# Patient Record
Sex: Female | Born: 1995 | Hispanic: Yes | Marital: Married | Smoking: Current every day smoker
Health system: Southern US, Community
[De-identification: ages and names within clinical notes are randomized; demographics above are authoritative.]

## PROBLEM LIST (undated history)

## (undated) DIAGNOSIS — N809 Endometriosis, unspecified: Secondary | ICD-10-CM

## (undated) DIAGNOSIS — F329 Major depressive disorder, single episode, unspecified: Secondary | ICD-10-CM

## (undated) DIAGNOSIS — F419 Anxiety disorder, unspecified: Secondary | ICD-10-CM

## (undated) DIAGNOSIS — F17200 Nicotine dependence, unspecified, uncomplicated: Secondary | ICD-10-CM

## (undated) DIAGNOSIS — F32A Depression, unspecified: Secondary | ICD-10-CM

## (undated) DIAGNOSIS — N83209 Unspecified ovarian cyst, unspecified side: Secondary | ICD-10-CM

## (undated) HISTORY — PX: OVARIAN CYST REMOVAL: SHX89

## (undated) HISTORY — DX: Unspecified ovarian cyst, unspecified side: N83.209

## (undated) HISTORY — PX: CHOLECYSTECTOMY: SHX55

## (undated) HISTORY — DX: Depression, unspecified: F32.A

## (undated) HISTORY — DX: Endometriosis, unspecified: N80.9

## (undated) HISTORY — DX: Nicotine dependence, unspecified, uncomplicated: F17.200

## (undated) HISTORY — DX: Anxiety disorder, unspecified: F41.9

---
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
## 2019-09-14 ENCOUNTER — Ambulatory Visit: Payer: Medicaid Other | Admitting: Family Medicine

## 2019-09-14 ENCOUNTER — Other Ambulatory Visit: Payer: Self-pay

## 2019-09-14 ENCOUNTER — Encounter: Payer: Self-pay | Admitting: Family Medicine

## 2019-09-14 ENCOUNTER — Other Ambulatory Visit (HOSPITAL_COMMUNITY)
Admission: RE | Admit: 2019-09-14 | Discharge: 2019-09-14 | Disposition: A | Discharge status: Home / Self Care | Payer: Medicaid Other | Source: Ambulatory Visit | Attending: Family Medicine | Admitting: Family Medicine

## 2019-09-14 VITALS — BP 122/82 | HR 78 | Temp 98.3°F | Resp 14 | Ht 67.0 in | Wt 164.6 lb

## 2019-09-14 DIAGNOSIS — Z23 Encounter for immunization: Secondary | ICD-10-CM

## 2019-09-14 DIAGNOSIS — F419 Anxiety disorder, unspecified: Secondary | ICD-10-CM

## 2019-09-14 DIAGNOSIS — F332 Major depressive disorder, recurrent severe without psychotic features: Secondary | ICD-10-CM | POA: Diagnosis not present

## 2019-09-14 DIAGNOSIS — Z62810 Personal history of physical and sexual abuse in childhood: Secondary | ICD-10-CM

## 2019-09-14 DIAGNOSIS — F53 Postpartum depression: Secondary | ICD-10-CM

## 2019-09-14 DIAGNOSIS — Z818 Family history of other mental and behavioral disorders: Secondary | ICD-10-CM

## 2019-09-14 DIAGNOSIS — N898 Other specified noninflammatory disorders of vagina: Secondary | ICD-10-CM

## 2019-09-14 DIAGNOSIS — Z7689 Persons encountering health services in other specified circumstances: Secondary | ICD-10-CM

## 2019-09-14 DIAGNOSIS — O99345 Other mental disorders complicating the puerperium: Secondary | ICD-10-CM

## 2019-09-14 DIAGNOSIS — Z113 Encounter for screening for infections with a predominantly sexual mode of transmission: Secondary | ICD-10-CM | POA: Insufficient documentation

## 2019-09-14 DIAGNOSIS — F1111 Opioid abuse, in remission: Secondary | ICD-10-CM

## 2019-09-14 MED ORDER — METRONIDAZOLE 500 MG PO TABS: 500.0000 mg | ORAL_TABLET | Freq: Two times a day (BID) | ORAL | 0 refills | Status: DC

## 2019-09-14 MED ORDER — SERTRALINE HCL 25 MG PO TABS: ORAL_TABLET | ORAL | 0 refills | Status: DC

## 2019-09-14 MED ORDER — HYDROXYZINE PAMOATE 25 MG PO CAPS: 25.0000 mg | ORAL_CAPSULE | Freq: Every day | ORAL | 0 refills | Status: DC
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
## 2019-09-17 ENCOUNTER — Encounter: Payer: Self-pay | Admitting: Family Medicine

## 2019-09-18 ENCOUNTER — Encounter: Payer: Self-pay | Admitting: *Deleted

## 2019-09-18 ENCOUNTER — Ambulatory Visit: Payer: Self-pay | Admitting: *Deleted

## 2019-09-18 DIAGNOSIS — F419 Anxiety disorder, unspecified: Secondary | ICD-10-CM

## 2019-09-18 DIAGNOSIS — F332 Major depressive disorder, recurrent severe without psychotic features: Secondary | ICD-10-CM

## 2019-09-18 LAB — CERVICOVAGINAL ANCILLARY ONLY
Bacterial Vaginitis (gardnerella): POSITIVE — AB
Candida Glabrata: NEGATIVE
Candida Vaginitis: NEGATIVE
Chlamydia: NEGATIVE
Comment: NEGATIVE
Comment: NEGATIVE
Comment: NEGATIVE
Comment: NEGATIVE
Comment: NEGATIVE
Comment: NORMAL
Neisseria Gonorrhea: NEGATIVE
Trichomonas: NEGATIVE

## 2019-09-18 NOTE — Progress Notes (Signed)
This encounter was created in error - please disregard.

## 2019-09-19 NOTE — Chronic Care Management (AMB) (Signed)
Care Management    Clinical Social Work General Note  09/19/2019 Name: Whitney Wong MRN: [id number] DOB: 1996/01/13  Whitney Wong is a 23 y.o. year old female who is a primary care patient of Danelle Berry, [place]. The CCM was consulted to assist the patient with Mental Health Counseling and Resources.   Ms. Staples was given information about Care Management services today including:  1. CM service includes personalized support from designated clinical staff supervised by her physician, including individualized plan of care and coordination with other care providers 2. 24/7 contact phone numbers for assistance for urgent and routine care needs. Patient agreed to services and verbal consent obtained.   Review of patient status, including review of consultants reports, relevant laboratory and other test results, and collaboration with appropriate care team members and the patient's provider was performed as part of comprehensive patient evaluation and provision of chronic care management services.    SDOH (Social Determinants of Health) screening performed today. See Care Plan Entry related to challenges with: Tobacco Use Stress Physical Activity  Advanced Directives Status: <no information> See Care Plan for related entries.   Outpatient Encounter Medications as of 09/18/2019  Medication Sig  . hydrOXYzine (VISTARIL) 25 MG capsule Take 1-4 capsules (25-100 mg total) by mouth at bedtime.  . methadone (DOLOPHINE) 10 MG tablet Take 10 mg by mouth every 8 (eight) hours.  . metroNIDAZOLE (FLAGYL) 500 MG tablet Take 1 tablet (500 mg total) by mouth 2 (two) times daily.  . sertraline (ZOLOFT) 25 MG tablet Take one tap PO at bedtime daily x 2 weeks, then increase to 2 tabs (50 mg) PO q bedtime daily   No facility-administered encounter medications on file as of 09/18/2019.     Goals Addressed            This Visit's Progress   . "I want to get to the bottom of why I have so much anxiety"  (pt-stated)       Current Barriers:  . Chronic Mental Health needs related to anxiety . Lacks knowledge of community resource: local mental health agencies that treat chronic anxiety . Suicidal Ideation/Homicidal Ideation: No  Clinical Social Work Goal(s):  Marland Kitchen Over the next 90 days, patient will work with SW bi-weekly by telephone or in person to identify a local therapist that specializes in the treatment of chronic anxiety .   Interventions: . Patient interviewed and appropriate assessments performed: PHQ 2 and PHQ 9 . Patient interviewed and appropriate assessments performed . Provided patient with information about local mental health agencies that treat anxiety: [facility] [phone] and [phone] contact information provided . Discussed plans with patient for ongoing care management follow up and provided patient with direct contact information for care management team . Encouraged patient to contact agency of choice to schedule the initial intake appointment . Emotional support and positive reinforcement provided related to patient's open discussion of post partum depression, her past addiction, current involvement with the [facility] Social [facility] and the methadone clinic . Positive reinforcement provided in regards to patient's virtual NA meeting attendance 1-2 times per week  . Confirmed that patient has no sponsor however has a supportive husband who transports her to the methadone clinic daily . Confirmed that patient sees a substance abuse counselor at the [facility] 1 day per week  Patient Self Care Activities:  . Self administers medications as prescribed . Attends all scheduled provider appointments . Performs ADL's independently . Performs IADL's independently .  Calls provider office for new concerns or questions . Motivation for treatment . Strong family or social support  Patient Coping Strengths:  . Family . Self  Advocate . Able to Communicate Effectively  Patient Self Care Deficits:  . Lacks knowledge of in network mental health providers  Initial goal documentation         Follow Up Plan: SW will follow up with patient by phone over the next 2 weeks       [place], [place] Worker  [place] Care Management [phone]

## 2019-09-19 NOTE — Patient Instructions (Addendum)
Thank you allowing the Chronic Care Management Team to be a part of your care! It was a pleasure speaking with you today!  1. Please call the mental health agency of choice to schedule your initial appointment 2. Please call this social worker with any questions or concerns regaring your mental health needs  CCM (Chronic Care Management) Team   Neldon Labella RN, BSN Nurse Care Coordinator  [phone]  Ruben Reason PharmD  Clinical Pharmacist  [phone]   [place], LCSW Clinical Social Worker [phone]  Goals Addressed            This Visit's Progress   . "I want to get to the bottom of why I have so much anxiety" (pt-stated)       Current Barriers:  . Chronic Mental Health needs related to anxiety . Lacks knowledge of community resource: local mental health agencies that treat chronic anxiety . Suicidal Ideation/Homicidal Ideation: No  Clinical Social Work Goal(s):  Marland Kitchen Over the next 90 days, patient will work with SW bi-weekly by telephone or in person to identify a local therapist that specializes in the treatment of chronic anxiety .   Interventions: . Patient interviewed and appropriate assessments performed: PHQ 2 and PHQ 9 . Patient interviewed and appropriate assessments performed . Provided patient with information about local mental health agencies that treat anxiety: [facility] [phone] and [facility]-[phone] contact information provided . Discussed plans with patient for ongoing care management follow up and provided patient with direct contact information for care management team . Encouraged patient to contact agency of choice to schedule the initial intake appointment . Emotional support and positive reinforcement provided related to patient's open discussion of post partum depression, her past addiction, current involvement with the Department of Social Services and the methadone clinic . Positive  reinforcement provided in regards to patient's virtual NA meeting attendance 1-2 times per week  . Confirmed that patient has no sponsor however has a supportive husband who transports her to the methadone clinic daily . Confirmed that patient sees a substance abuse counselor at the [facility] 1 day per week  Patient Self Care Activities:  . Self administers medications as prescribed . Attends all scheduled provider appointments . Performs ADL's independently . Performs IADL's independently . Calls provider office for new concerns or questions . Motivation for treatment . Strong family or social support  Patient Coping Strengths:  . Family . Self Advocate . Able to Communicate Effectively  Patient Self Care Deficits:  . Lacks knowledge of in network mental health providers  Initial goal documentation         The patient verbalized understanding of instructions provided today and declined a print copy of patient instruction materials.   Telephone follow up appointment with care management team member scheduled for:10/02/19

## 2019-10-02 ENCOUNTER — Telehealth: Payer: Medicaid Other | Admitting: *Deleted

## 2019-10-02 ENCOUNTER — Ambulatory Visit: Payer: Self-pay | Admitting: *Deleted

## 2019-10-02 NOTE — Chronic Care Management (AMB) (Signed)
    Care Management   Unsuccessful Call Note 10/02/2019 Name: Whitney Wong MRN: [id number] DOB: 05/05/1996  Patient  is a 23 year old female who sees Delsa Grana, [place] for primary care. Delsa Grana PA-C asked the CCM team to consult the patient for Mental Health Counseling and Resources.    This social worker was unable to reach patient via telephone today to follow up on resources provided. I have left HIPAA compliant voicemail asking patient to return my call. (unsuccessful outreach #1).   Plan: Will follow-up within 7 business days via telephone.     Elliot Gurney, [place] Administrator, [facility] [phone]

## 2019-10-08 ENCOUNTER — Ambulatory Visit (INDEPENDENT_AMBULATORY_CARE_PROVIDER_SITE_OTHER): Payer: Self-pay | Admitting: Psychiatry

## 2019-10-08 ENCOUNTER — Other Ambulatory Visit: Payer: Self-pay

## 2019-10-08 DIAGNOSIS — Z91199 Patient's noncompliance with other medical treatment and regimen due to unspecified reason: Secondary | ICD-10-CM | POA: Insufficient documentation

## 2019-10-08 DIAGNOSIS — Z5329 Procedure and treatment not carried out because of patient's decision for other reasons: Secondary | ICD-10-CM

## 2019-10-08 NOTE — Progress Notes (Signed)
No response to call or text. 

## 2019-10-09 ENCOUNTER — Telehealth: Payer: Self-pay | Admitting: *Deleted

## 2019-10-09 ENCOUNTER — Ambulatory Visit: Payer: Self-pay | Admitting: *Deleted

## 2019-10-09 NOTE — Chronic Care Management (AMB) (Signed)
    Care Management   Unsuccessful Call Note 10/09/2019 Name: Whitney Wong MRN: [id number] DOB: 08-19-1996  Patient  is a 23 year old female  who sees Delsa Grana, [place] for primary care. Delsa Grana, PA-C asked the CCM team to consult the patient for Mental Health Counseling and Resources.     This social worker was unable to reach patient via telephone today to follow up on mental health resources previously provided. I have left HIPAA compliant voicemail asking patient to return my call. (unsuccessful outreach #2).   Plan: Will follow-up within 7 business days via telephone.     Elliot Gurney, [place] Administrator, [facility] [phone]

## 2019-10-16 ENCOUNTER — Ambulatory Visit (INDEPENDENT_AMBULATORY_CARE_PROVIDER_SITE_OTHER): Payer: Medicaid Other | Admitting: Family Medicine

## 2019-10-16 ENCOUNTER — Encounter: Payer: Self-pay | Admitting: Family Medicine

## 2019-10-16 ENCOUNTER — Ambulatory Visit: Payer: Self-pay | Admitting: *Deleted

## 2019-10-16 ENCOUNTER — Other Ambulatory Visit: Payer: Self-pay

## 2019-10-16 VITALS — HR 80 | Ht 66.0 in | Wt 157.0 lb

## 2019-10-16 DIAGNOSIS — F332 Major depressive disorder, recurrent severe without psychotic features: Secondary | ICD-10-CM

## 2019-10-16 DIAGNOSIS — F1120 Opioid dependence, uncomplicated: Secondary | ICD-10-CM

## 2019-10-16 DIAGNOSIS — F1190 Opioid use, unspecified, uncomplicated: Secondary | ICD-10-CM

## 2019-10-16 DIAGNOSIS — K5900 Constipation, unspecified: Secondary | ICD-10-CM | POA: Diagnosis not present

## 2019-10-16 DIAGNOSIS — F419 Anxiety disorder, unspecified: Secondary | ICD-10-CM | POA: Insufficient documentation

## 2019-10-16 NOTE — Progress Notes (Signed)
Name: Whitney Wong   MRN: [id number]    DOB: Nov 17, 1995   Date:10/16/2019       Progress Note  Subjective:    Chief Complaint  Chief Complaint  Patient presents with  . Follow-up  . Depression    pt never started meds, states she wants to see pysch 1st  . Constipation    1-2 bowel ,ovements a week, has tried meralax, stool softners    I connected with  Erskine Speed  on 10/16/19 at  1:00 PM EST by a video enabled telemedicine application and verified that I am speaking with the correct person using two identifiers.  I discussed the limitations of evaluation and management by telemedicine and the availability of in person appointments. The patient expressed understanding and agreed to proceed. Staff also discussed with the patient that there may be a patient responsible charge related to this service. Patient Location: home Provider Location: [facility] clinic Additional Individuals present: none  HPI   Anxiety/Depression: Patient states that her anxiety and her depression have been gradually getting better, she was hesitant to start the Zoloft medication.  She stated "no offense" she did want to see a professional for a more in-depth assessment, she also has had a past problem with medication for anxiety and depression prescribed by PCP she stated she had a horrible reaction to it and was very concerned about starting Zoloft.  She was referred to psychiatry and even had an appointment with in a few weeks of her last appointment but she was a no-show.  She states that she was having trouble with her phone and she could not get it to connect to the virtual encounter.  She has not rescheduled or attempted to call them.  She was encouraged to do so so that they will be willing to see her and they will know it was a technical difficulty and not her indifference or a true no-show.  She denies any suicidal ideation, she is sleeping better, her mood is slightly better and things are okay right now.  Depression screen [facility] 2/9 10/16/2019 09/18/2019 09/14/2019  Decreased Interest Down, Depressed, Hopeless PHQ - 2 Score Altered sleeping Tired, decreased energy Change in appetite Feeling bad or failure about yourself  Trouble concentrating Moving slowly or fidgety/restless 0 0 3  Suicidal thoughts 0 1 1  PHQ-9 Score Difficult doing work/chores Somewhat difficult Somewhat difficult Somewhat difficult   PHQ reviewed today, overall score gradually improving but still positive.    Constipation: She does complain about severe constipation having bowel movements sometimes only once a week, she recently had to manually disimpact herself because she was so severely constipated.  She states she has tried [place] about a capful once a day for about a week and it did not help her symptoms at all.  She also tried other over-the-counter stool softeners for 3 days without any success.  She has not tried any suppositories or enemas.  She is methadone.  Not doing any fiber supplements or working on any particular diet or fluid goals.  She did have a little bit of pain with her most recent bowel movements she has some hesitancy when having bowel movements because of that pain she believes she had some blood streaks on the toilet when she wiped afterwards.  She has not had any large amounts of bright red blood per rectum no blood in stool no melena.  She has had some abdominal discomfort when bowel movements have been for more than a week spaced apart.  She has never been to GI.  No N, V, incontinence of watery stool or of urine.     Patient Active Problem List   Diagnosis Date Noted  . Methadone use (HCC) 10/17/2019  . Constipation 10/17/2019  . Anxiety disorder 10/16/2019  . Severe episode of recurrent major depressive disorder, without psychotic features (HCC) 10/16/2019  . No-show for appointment 10/08/2019    Social History   Tobacco  Use  . Smoking status: Current Every Day Smoker    Packs/day: 0.25    Types: Cigarettes  . Smokeless tobacco: Never Used  Substance Use Topics  . Alcohol use: Never    Frequency: Never     Current Outpatient Medications:  .  methadone (DOLOPHINE) 10 MG tablet, Take 10 mg by mouth every 8 (eight) hours., Disp: , Rfl:  .  hydrOXYzine (VISTARIL) 25 MG capsule, Take 1-4 capsules (25-100 mg total) by mouth at bedtime. (Patient not taking: Reported on 10/16/2019), Disp: 30 capsule, Rfl: 0 .  metroNIDAZOLE (FLAGYL) 500 MG tablet, Take 1 tablet (500 mg total) by mouth 2 (two) times daily. (Patient not taking: Reported on 10/16/2019), Disp: 14 tablet, Rfl: 0 .  sertraline (ZOLOFT) 25 MG tablet, Take one tap PO at bedtime daily x 2 weeks, then increase to 2 tabs (50 mg) PO q bedtime daily (Patient not taking: Reported on 10/16/2019), Disp: 60 tablet, Rfl: 0  No Known Allergies  I personally reviewed active problem list, medication list, allergies, family history, social history, health maintenance, notes from last encounter, lab results, imaging with the patient/caregiver today.  Review of Systems  Constitutional: Negative.   HENT: Negative.   Eyes: Negative.   Respiratory: Negative.   Cardiovascular: Negative.   Gastrointestinal: Negative.   Endocrine: Negative.   Genitourinary: Negative.   Musculoskeletal: Negative.   Skin: Negative.   Allergic/Immunologic: Negative.   Neurological: Negative.   Hematological: Negative.   Psychiatric/Behavioral: Negative.   All other systems reviewed and are negative.    Objective:   Virtual encounter, vitals limited, only able to obtain the following Today's Vitals   10/16/19 1057  Pulse: 80  Weight: 157 lb (71.2 kg)  Height: 5\' 6"  (1.676 m)   Body mass index is 25.34 kg/m. Nursing Note and Vital Signs reviewed.  Physical Exam Vitals signs and nursing note reviewed.  Constitutional:      General: She is not in acute distress.     Appearance: Normal appearance. She is well-developed. She is not ill-appearing, toxic-appearing or diaphoretic.  HENT:     Head: Normocephalic and atraumatic.     Nose: Nose normal.  Eyes:     General:        Right eye: No discharge.        Left eye: No discharge.     Conjunctiva/sclera: Conjunctivae normal.  Neck:     Musculoskeletal: Normal range of motion.     Trachea: No tracheal deviation.  Pulmonary:     Effort: Pulmonary effort is normal. No respiratory distress.     Breath sounds: No stridor.  Musculoskeletal: Normal range of motion.  Skin:    Coloration: Skin is not jaundiced or pale.     Findings: No erythema or rash.  Neurological:     Mental Status: She is alert.  Motor: No abnormal muscle tone.     Coordination: Coordination normal.  Psychiatric:        Mood and Affect: Mood normal.        Behavior: Behavior normal.        Thought Content: Thought content normal.        Judgment: Judgment normal.     PE limited by telephone encounter  No results found for this or any previous visit (from the past 72 hour(s)).  Assessment and Plan:     ICD-10-CM   1. Anxiety disorder, unspecified type  F41.9    sx improving, she did not want to start meds, encouraged to call psych to reschedule her appt  2. Severe episode of recurrent major depressive disorder, without psychotic features (HCC)  F33.2    moods not as severe, referred to psych but could not get her phone to connect for appt and was "no show" strongly urged her to call and reschedule  3. Constipation, unspecified constipation type  K59.00    severe requiring recent manual disimpaction, likely SE of meds, push hydration and fiber, do bowel cleanse then start daily stool softeners  4. Methadone use (HCC)  F11.20      -Red flags and when to present for emergency care or RTC including fever >101.83F, chest pain, shortness of breath, new/worsening/un-resolving symptoms, severe abdominal pain with N/V, reviewed  with patient at time of visit. Follow up and care instructions discussed and provided in AVS - instructions given through  mychart on miralax bowel wash out - and other options for chronic constipation management with methadone use.  - I discussed the assessment and treatment plan with the patient. The patient was provided an opportunity to ask questions and all were answered. The patient agreed with the plan and demonstrated an understanding of the instructions.  Instructions given to pt in AVS: After bowel cleanse - start with 17 g (one cap full) of powder dissolved in 8 oz of water once daily and titrate up or down (to a maximum of 34 g daily) to effect.   You can also try colase pills (dolcusate) and take 1-2 x a day, pills may be easier but it may be less effective than the miralax.  You can also try increasing fiber in diet with increased fluids.  When your gut is lowed by medications, fiber can sometimes cause bloating or discomfort.  You could try lower dose fiber supplements like metamucil or benefiber and see if it help you have more normal bowel movements.  Follow up if not improving in the next month - we can refer you to GI to help evaluate your bowel.  Which were compiled for her.  Instructions sent through mychart:  Dear Ms. Mikel,  Here is the info for a bowel cleanse. Drink ONLY clear liquids ALL DAY. NO SOLID FOODS. Marland Kitchen Clear liquids include strained fruit juices (no pulp): apple, white grape, broth, water, Gatorade, Popsicles, Jell-O, coffee or tea (with no creamer). . Avoid liquids that contain red, blue or purple artificial dyes. . Drink at least 8 glasses of clear liquids throughout the day. . At noon: Swallow four of the Dulcolax (bisacodyl) 5 mg tablets. . At 5:00 pm: Mix entire container of Miralax in 64 oz. of Gatorade. Shake solution until Miralax is dissolved. Start drinking 8 oz. every 15 minutes until HALF the bottle (32 oz) is consumed. Drink it quickly. DO NOT  SIP. If you become nauseated, you may drink the solution more slowly.  You may refrigerate or add ice. . Stay near a toilet. Over the next several hours, you will have diarrhea, which can be quite sudden. This is normal, as the purpose of this process is to empty your colon.  If you do not have bowel movements, then you can drink the rest of the gatorade the next morning.  . You may have skin irritation from frequent wiping with toilet paper, you can use tucks wipes, vaseline and/or preparation H ointment to help calm/sooth.  Remainder of your constipation info with will in the after visit summary which will be available when I complete the chart and close the visit - basic info about hydration, fiber, and normal daily stool softener use which you will need to do after you complete the bowel cleanse.     I provided 15 minutes of non-face-to-face time during this encounter.  Greater than 50% of this visit was spent in direct face-to-face counseling, obtaining history and physical, discussing and educating pt on treatment plan.  Total time of this visit was 30 min.  Remainder of time involved but was not limited to reviewing chart (recent and pertinent OV notes and labs), documentation in EMR, and coordinating care and treatment plan - mostly writing out extensive instructions for pt and sending to her through mychart.     Delsa Grana, PA-C 10/16/19 4:44 PM

## 2019-10-16 NOTE — Chronic Care Management (AMB) (Signed)
Care Management    Clinical Social Work Follow Up Note  10/16/2019 Name: Whitney Wong MRN: [id number] DOB: May 18, 1996  Whitney Wong is a 23 y.o. year old female who is a primary care patient of Delsa Grana, [place]. The CCM team was consulted for assistance with Mental Health Counseling and Resources.   Review of patient status, including review of consultants reports, other relevant assessments, and collaboration with appropriate care team members and the patient's provider was performed as part of comprehensive patient evaluation and provision of chronic care management services.    Advanced Directives Status: <no information> See Care Plan for related entries.   Outpatient Encounter Medications as of 10/16/2019  Medication Sig  . hydrOXYzine (VISTARIL) 25 MG capsule Take 1-4 capsules (25-100 mg total) by mouth at bedtime. (Patient not taking: Reported on 10/16/2019)  . methadone (DOLOPHINE) 10 MG tablet Take 10 mg by mouth every 8 (eight) hours.  . metroNIDAZOLE (FLAGYL) 500 MG tablet Take 1 tablet (500 mg total) by mouth 2 (two) times daily. (Patient not taking: Reported on 10/16/2019)  . sertraline (ZOLOFT) 25 MG tablet Take one tap PO at bedtime daily x 2 weeks, then increase to 2 tabs (50 mg) PO q bedtime daily (Patient not taking: Reported on 10/16/2019)   No facility-administered encounter medications on file as of 10/16/2019.      Goals Addressed            This Visit's Progress   . "I want to get to the bottom of why I have so much anxiety" (pt-stated)       Current Barriers:  . Chronic Mental Health needs related to anxiety . Lacks knowledge of community resource: local mental health agencies that treat chronic anxiety . Suicidal Ideation/Homicidal Ideation: No  Clinical Social Work Goal(s):  Marland Kitchen Over the next 90 days, patient will work with SW bi-weekly by telephone or in person to identify a local therapist that specializes in the treatment of chronic anxiety .    Interventions: . Followed up on information previously provided about local mental health agencies that treat anxiety: [facility] [phone] and [phone]  . Confirmed that patient had a scheduled appointment with [facility] but missed it due to a problem with her phone . Contact number provided to the [place], patient encouraged to call and re-schedule the initial appointment . Discussed plans with patient for ongoing care management follow up and provided patient with direct contact information for care management team   Patient Self Care Activities:  . Self administers medications as prescribed . Attends all scheduled provider appointments . Performs ADL's independently . Performs IADL's independently . Calls provider office for new concerns or questions . Motivation for treatment . Strong family or social support  Patient Coping Strengths:  . Family . Self Advocate . Able to Communicate Effectively  Patient Self Care Deficits:  . Lacks knowledge of in network mental health providers  Please see past updates related to this goal by clicking on the "Past Updates" button in the selected goal          Follow Up Plan: SW will follow up with patient by phone over the next 7-10 business days   Elliot Gurney, [place] Worker  [facility] Care Management [phone]

## 2019-10-16 NOTE — Patient Instructions (Addendum)
Thank you allowing the Chronic Care Management Team to be a part of your care! It was a pleasure speaking with you today!  1. Please be sure to contact [place] to re-schedule your initial intake appointment.  CCM (Chronic Care Management) Team   Neldon Labella RN, BSN Nurse Care Coordinator  [phone]  Ruben Reason PharmD  Clinical Pharmacist  [phone]   Elliot Gurney, LCSW Clinical Social Worker [phone]  Goals Addressed            This Visit's Progress   . "I want to get to the bottom of why I have so much anxiety" (pt-stated)       Current Barriers:  . Chronic Mental Health needs related to anxiety . Lacks knowledge of community resource: local mental health agencies that treat chronic anxiety . Suicidal Ideation/Homicidal Ideation: No  Clinical Social Work Goal(s):  Marland Kitchen Over the next 90 days, patient will work with SW bi-weekly by telephone or in person to identify a local therapist that specializes in the treatment of chronic anxiety .   Interventions: . Followed up on information previously provided about local mental health agencies that treat anxiety: [facility] [phone] and [facility]-[phone]  . Confirmed that patient had a scheduled appointment with [place] but missed it due to a problem with her phone . Contact number provided to the [place], patient encouraged to call and re-schedule the initial appointment . Discussed plans with patient for ongoing care management follow up and provided patient with direct contact information for care management team   Patient Self Care Activities:  . Self administers medications as prescribed . Attends all scheduled provider appointments . Performs ADL's independently . Performs IADL's independently . Calls provider office for new concerns or questions . Motivation for treatment . Strong family or  social support  Patient Coping Strengths:  . Family . Self Advocate . Able to Communicate Effectively  Patient Self Care Deficits:  . Lacks knowledge of in network mental health providers  Please see past updates related to this goal by clicking on the "Past Updates" button in the selected goal          The patient verbalized understanding of instructions provided today and declined a print copy of patient instruction materials.   Telephone follow up appointment with care management team member scheduled for:10/23/19

## 2019-10-16 NOTE — Patient Instructions (Addendum)
After bowel cleanse - start with 17 g (one cap full) of powder dissolved in 8 oz of water once daily and titrate up or down (to a maximum of 34 g daily) to effect.   You can also try colase pills (dolcusate) and take 1-2 x a day, pills may be easier but it may be less effective than the miralax.  You can also try increasing fiber in diet with increased fluids.  When your gut is lowed by medications, fiber can sometimes cause bloating or discomfort.  You could try lower dose fiber supplements like metamucil or benefiber and see if it help you have more normal bowel movements.  Follow up if not improving in the next month - we can refer you to GI to help evaluate your bowel.   Constipation, Adult Constipation is when a person:  Poops (has a bowel movement) fewer times in a week than normal.  Has a hard time pooping.  Has poop that is dry, hard, or bigger than normal.   Follow these instructions at home: Eating and drinking   Eat foods that have a lot of fiber, such as: ? Fresh fruits and vegetables. ? Whole grains. ? Beans.  Eat less of foods that are high in fat, low in fiber, or overly processed, such as: ? [place] fries. ? Hamburgers. ? Cookies. ? Candy. ? Soda.  Drink enough fluid to keep your pee (urine) clear or pale yellow.  General instructions  Exercise regularly or as told by your doctor.  Go to the restroom when you feel like you need to poop. Do not hold it in.  Take over-the-counter and prescription medicines only as told by your doctor. These include any fiber supplements.  Do pelvic floor retraining exercises, such as: ? Doing deep breathing while relaxing your lower belly (abdomen). ? Relaxing your pelvic floor while pooping.  Watch your condition for any changes.  Keep all follow-up visits as told by your doctor. This is important.  Contact a doctor if:  You have pain that gets worse.  You have a fever.  You have not pooped for 4 days.  You  throw up (vomit).  You are not hungry.  You lose weight.  You are bleeding from the anus.  You have thin, pencil-like poop (stool).   Get help right away if:  You have a fever, and your symptoms suddenly get worse.  You leak poop or have blood in your poop.  Your belly feels hard or bigger than normal (is bloated).  You have very bad belly pain.  You feel dizzy or you faint. This information is not intended to replace advice given to you by your health care provider. Make sure you discuss any questions you have with your health care provider. Document Released: 04/19/2008 Document Revised: 10/14/2017 Document Reviewed: 04/21/2016 Elsevier Patient Education  2020 [employer].   Hemorrhoids Hemorrhoids are swollen veins that may develop:  In the butt (rectum). These are called internal hemorrhoids.  Around the opening of the butt (anus). These are called external hemorrhoids. Hemorrhoids can cause pain, itching, or bleeding. Most of the time, they do not cause serious problems. They usually get better with diet changes, lifestyle changes, and other home treatments. What are the causes? This condition may be caused by:  Having trouble pooping (constipation).  Pushing hard (straining) to poop.  Watery poop (diarrhea).  Pregnancy.  Being very overweight (obese).  Sitting for long periods of time.  Heavy lifting or other activity  that causes you to strain.  Anal sex.  Riding a bike for a long period of time. What are the signs or symptoms? Symptoms of this condition include:  Pain.  Itching or soreness in the butt.  Bleeding from the butt.  Leaking poop.  Swelling in the area.  One or more lumps around the opening of your butt. How is this diagnosed? A doctor can often diagnose this condition by looking at the affected area. The doctor may also:  Do an exam that involves feeling the area with a gloved hand (digital rectal exam).  Examine the area  inside your butt using a small tube (anoscope).  Order blood tests. This may be done if you have lost a lot of blood.  Have you get a test that involves looking inside the colon using a flexible tube with a camera on the end (sigmoidoscopy or colonoscopy). How is this treated? This condition can usually be treated at home. Your doctor may tell you to change what you eat, make lifestyle changes, or try home treatments. If these do not help, procedures can be done to remove the hemorrhoids or make them smaller. These may involve:  Placing rubber bands at the base of the hemorrhoids to cut off their blood supply.  Injecting medicine into the hemorrhoids to shrink them.  Shining a type of light energy onto the hemorrhoids to cause them to fall off.  Doing surgery to remove the hemorrhoids or cut off their blood supply. Follow these instructions at home: Eating and drinking   Eat foods that have a lot of fiber in them. These include whole grains, beans, nuts, fruits, and vegetables.  Ask your doctor about taking products that have added fiber (fibersupplements).  Reduce the amount of fat in your diet. You can do this by: ? Eating low-fat dairy products. ? Eating less red meat. ? Avoiding processed foods.  Drink enough fluid to keep your pee (urine) pale yellow. Managing pain and swelling   Take a warm-water bath (sitz bath) for 20 minutes to ease pain. Do this 3-4 times a day. You may do this in a bathtub or using a portable sitz bath that fits over the toilet.  If told, put ice on the painful area. It may be helpful to use ice between your warm baths. ? Put ice in a plastic bag. ? Place a towel between your skin and the bag. ? Leave the ice on for 20 minutes, 2-3 times a day. General instructions  Take over-the-counter and prescription medicines only as told by your doctor. ? Medicated creams and medicines may be used as told.  Exercise often. Ask your doctor how much and what  kind of exercise is best for you.  Go to the bathroom when you have the urge to poop. Do not wait.  Avoid pushing too hard when you poop.  Keep your butt dry and clean. Use wet toilet paper or moist towelettes after pooping.  Do not sit on the toilet for a long time.  Keep all follow-up visits as told by your doctor. This is important. Contact a doctor if you:  Have pain and swelling that do not get better with treatment or medicine.  Have trouble pooping.  Cannot poop.  Have pain or swelling outside the area of the hemorrhoids. Get help right away if you have:  Bleeding that will not stop. Summary  Hemorrhoids are swollen veins in the butt or around the opening of the butt.  They  can cause pain, itching, or bleeding.  Eat foods that have a lot of fiber in them. These include whole grains, beans, nuts, fruits, and vegetables.  Take a warm-water bath (sitz bath) for 20 minutes to ease pain. Do this 3-4 times a day. This information is not intended to replace advice given to you by your health care provider. Make sure you discuss any questions you have with your health care provider. Document Released: 08/10/2008 Document Revised: 11/09/2018 Document Reviewed: 03/23/2018 Elsevier Patient Education  2020 [employer].

## 2019-10-17 DIAGNOSIS — F1120 Opioid dependence, uncomplicated: Secondary | ICD-10-CM | POA: Insufficient documentation

## 2019-10-17 DIAGNOSIS — K5900 Constipation, unspecified: Secondary | ICD-10-CM | POA: Insufficient documentation

## 2019-10-17 DIAGNOSIS — F1190 Opioid use, unspecified, uncomplicated: Secondary | ICD-10-CM | POA: Insufficient documentation

## 2019-10-23 ENCOUNTER — Ambulatory Visit: Payer: Self-pay | Admitting: *Deleted

## 2019-10-23 ENCOUNTER — Telehealth: Payer: Self-pay | Admitting: *Deleted

## 2019-10-23 NOTE — Chronic Care Management (AMB) (Signed)
   Care Management   Unsuccessful Call Note 10/23/2019 Name: Whitney Wong MRN: [id number] DOB: [birth year]  Patient is a 23  year old female who sees Delsa Grana, [place] for primary care. Delsa Grana asked the CCM team to consult the patient for Mental Health Counseling and Resources.     This social worker was unable to reach patient via telephone today for follow up call. I have left HIPAA compliant voicemail asking patient to return my call. Per provider's note, patient is no longer interested in pursuing ongoing mental health treatment and did not want to start medications at this time.   Plan: This Education officer, museum will make no further attempts to follow up with patient in regards to ongoing mental health follow up. However be happy to engage patient upon her return call.      Elliot Gurney, [place] Administrator, [facility] Care Management [phone]

## 2019-12-27 ENCOUNTER — Encounter: Payer: Self-pay | Admitting: Family Medicine

## 2019-12-28 ENCOUNTER — Ambulatory Visit: Payer: Medicaid Other | Admitting: Family Medicine

## 2020-09-19 ENCOUNTER — Telehealth: Payer: Self-pay | Admitting: Family Medicine

## 2020-09-19 NOTE — Telephone Encounter (Signed)
We do not manage withdrawal - that goes to ER or treatment centers

## 2020-09-19 NOTE — Telephone Encounter (Signed)
Please advise 

## 2020-09-19 NOTE — Telephone Encounter (Signed)
Pt tapered herself off of methadone (DOLOPHINE) 10 MG tablet And her last day taking the treatment was 10 days ago/ pt states her symptoms are getting worse and not better and she wanted to know if Leisa could prescribe something for her insomnia and restlessness(little cramps throughout body) especially since she has to look after her one yr old. /Pt would like to get help with this asap/ Pt has scheduled an appt for Nov 15th but feels she needs to speak with her provider asap about this issue/please advise

## 2020-09-22 NOTE — Telephone Encounter (Signed)
Patient notified. She stated she tried [facility] but they wanted to change her to something else. She stated she know longer want to be on medication. Patient was informed to go to ER if she continue to have symptoms. Patient was given [facility] number to see if they know of any outpatient facilities or counseling that  maybe able to assist her.

## 2020-09-29 ENCOUNTER — Telehealth (INDEPENDENT_AMBULATORY_CARE_PROVIDER_SITE_OTHER): Payer: Medicaid Other | Admitting: Family Medicine

## 2020-09-29 ENCOUNTER — Encounter: Payer: Self-pay | Admitting: Family Medicine

## 2020-09-29 VITALS — Ht 66.5 in | Wt 171.0 lb

## 2020-09-29 DIAGNOSIS — F1193 Opioid use, unspecified with withdrawal: Secondary | ICD-10-CM

## 2020-09-29 DIAGNOSIS — M25531 Pain in right wrist: Secondary | ICD-10-CM | POA: Diagnosis not present

## 2020-09-29 DIAGNOSIS — F1123 Opioid dependence with withdrawal: Secondary | ICD-10-CM | POA: Diagnosis not present

## 2020-09-29 MED ORDER — DICYCLOMINE HCL 20 MG PO TABS: 20.0000 mg | ORAL_TABLET | Freq: Three times a day (TID) | ORAL | 1 refills | Status: DC | PRN

## 2020-09-29 NOTE — Progress Notes (Signed)
Name: Whitney Wong   MRN: [id number]    DOB: 1996/03/15   Date:09/29/2020       Progress Note  Subjective:   Chief Complaint  Chief Complaint  Patient presents with  . Medication Management    She is witdrawing from methadone. She has basic withdrawl symptoms. Diarrha, restless legs, insomnia, sweats and low energy.  . Extremity Weakness    Righ hand. She has dx of tendinits in the past. Has trouble opening things and she keeps dropping things.   I connected with  Erskine Speed  on 09/29/20 at  9:00 AM EST by a video enabled telemedicine application and verified that I am speaking with the correct person using two identifiers.  I discussed the limitations of evaluation and management by telemedicine and the availability of in person appointments. The patient expressed understanding and agreed to proceed. Staff also discussed with the patient that there may be a patient responsible charge related to this service. Patient Location: home Provider Location[facility] Additional Individuals present: none  HPI   Pt withdrawing from [facility]one- [facility] [place]- managed with benzos Last day of meds with [facility] this month, last day was Nov 1st Last day of methadone - liquid methadone 5 mg the last week of Oct She started on 120 mg methadone and tapered down 5 mg weekly   Diarrhea, restless legs, insomnia, sweats, fatigue  Right hand/wrist pain and weakness - trouble opening things, right hand dominant female in the last month, getting worse.  lafexadine   Diarrhea yesterday 4-5 times     Patient Active Problem List   Diagnosis Date Noted  . 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 Constipation 10/17/2019  . Anxiety disorder 10/16/2019  . Severe episode of recurrent major depressive disorder, without psychotic features (HCC) 10/16/2019  . No-show for appointment 10/08/2019    Social History   Tobacco Use  . Smoking status: Current Every Day Smoker     Packs/day: 0.25    Types: Cigarettes  . Smokeless tobacco: Never Used  Substance Use Topics  . Alcohol use: Never     Current Outpatient Medications:  .  methadone (DOLOPHINE) 10 MG tablet, Take 10 mg by mouth every 8 (eight) hours., Disp: , Rfl:   Allergies  Allergen Reactions  . Penicillins Hives    I personally reviewed active problem list, medication list, allergies, family history, social history, health maintenance, notes from last encounter, lab results, imaging with the patient/caregiver today.   Review of Systems  10 Systems reviewed and are negative for acute change except as noted in the HPI.   Objective:   Virtual encounter, vitals limited, only able to obtain the following Today's Vitals   09/29/20 0859  PainSc: 6    There is no height or weight on file to calculate BMI. Nursing Note and Vital Signs reviewed.  Physical Exam Patient phonation clear, nontoxic appearing, no seizure-like activity, no active vomiting PE limited by telephone encounter  No results found for this or any previous visit (from the past 72 hour(s)).  Assessment and Plan:     ICD-10-CM   1. Methadone withdrawal (HCC)  F11.23    Discussed with SP, we can help with some symptom support but cannot manage withdrawal she should follow-up with specialist  2. Right wrist pain  M25.531    Asked to come in for evaluation I cannot see or assess over video    Meds were called in for muscle cramps, diarrhea, muscle  spasms and insomnia Patient was encouraged to follow-up in the ER if having any concerning symptoms or inability to keep down fluids I am unable to see any records of her withdrawal or her dosing it is not in the controlled substance database and not in care everywhere  She was given resources for local outpatient psychiatric facilities, walk-in 24-hour mental health urgent care located in [place], and explained there are some outpatient intensive treatment centers and programs  for helping her continue to remain off narcotics  -Red flags and when to present for emergency care or RTC including fever >101.22F, chest pain, shortness of breath, new/worsening/un-resolving symptoms, reviewed with patient at time of visit. Follow up and care instructions discussed and provided in AVS. - I discussed the assessment and treatment plan with the patient. The patient was provided an opportunity to ask questions and all were answered. The patient agreed with the plan and demonstrated an understanding of the instructions.  I provided 30+ minutes of non-face-to-face time during this encounter.  Danelle Berry, PA-C 09/29/20 9:35 AM

## 2020-10-02 ENCOUNTER — Encounter: Payer: Self-pay | Admitting: Family Medicine

## 2020-10-03 MED ORDER — BACLOFEN 10 MG PO TABS: 10.0000 mg | ORAL_TABLET | Freq: Three times a day (TID) | ORAL | 2 refills | Status: DC | PRN

## 2020-10-03 MED ORDER — DICYCLOMINE HCL 20 MG PO TABS: 20.0000 mg | ORAL_TABLET | Freq: Three times a day (TID) | ORAL | 2 refills | Status: DC | PRN

## 2020-10-21 MED ORDER — HYDROXYZINE HCL 25 MG PO TABS: ORAL_TABLET | ORAL | 2 refills | Status: DC

## 2020-10-21 NOTE — Addendum Note (Signed)
Addended by: Danelle Berry on: 10/21/2020 12:45 PM   Modules accepted: Orders

## 2020-11-12 ENCOUNTER — Encounter: Payer: Self-pay | Admitting: Family Medicine

## 2020-12-15 ENCOUNTER — Ambulatory Visit: Payer: Medicaid Other | Admitting: Family Medicine

## 2020-12-15 ENCOUNTER — Other Ambulatory Visit: Payer: Self-pay

## 2020-12-15 ENCOUNTER — Telehealth: Payer: Self-pay

## 2020-12-15 ENCOUNTER — Encounter: Payer: Self-pay | Admitting: Family Medicine

## 2020-12-15 ENCOUNTER — Ambulatory Visit
Admission: RE | Admit: 2020-12-15 | Discharge: 2020-12-15 | Disposition: A | Discharge status: Home / Self Care | Payer: Medicaid Other | Attending: Family Medicine | Admitting: Family Medicine

## 2020-12-15 ENCOUNTER — Ambulatory Visit
Admission: RE | Admit: 2020-12-15 | Discharge: 2020-12-15 | Disposition: A | Discharge status: Home / Self Care | Payer: Medicaid Other | Source: Ambulatory Visit | Attending: Family Medicine | Admitting: Family Medicine

## 2020-12-15 VITALS — BP 122/70 | HR 97 | Temp 98.4°F | Ht 67.0 in | Wt 164.6 lb

## 2020-12-15 DIAGNOSIS — Z8742 Personal history of other diseases of the female genital tract: Secondary | ICD-10-CM

## 2020-12-15 DIAGNOSIS — M79672 Pain in left foot: Secondary | ICD-10-CM

## 2020-12-15 DIAGNOSIS — Z833 Family history of diabetes mellitus: Secondary | ICD-10-CM

## 2020-12-15 DIAGNOSIS — Z114 Encounter for screening for human immunodeficiency virus [HIV]: Secondary | ICD-10-CM | POA: Diagnosis not present

## 2020-12-15 DIAGNOSIS — R29898 Other symptoms and signs involving the musculoskeletal system: Secondary | ICD-10-CM

## 2020-12-15 DIAGNOSIS — R634 Abnormal weight loss: Secondary | ICD-10-CM

## 2020-12-15 DIAGNOSIS — Z1159 Encounter for screening for other viral diseases: Secondary | ICD-10-CM | POA: Diagnosis not present

## 2020-12-15 DIAGNOSIS — R202 Paresthesia of skin: Secondary | ICD-10-CM

## 2020-12-15 DIAGNOSIS — M79641 Pain in right hand: Secondary | ICD-10-CM | POA: Diagnosis not present

## 2020-12-15 DIAGNOSIS — R102 Pelvic and perineal pain: Secondary | ICD-10-CM

## 2020-12-15 DIAGNOSIS — R55 Syncope and collapse: Secondary | ICD-10-CM

## 2020-12-15 DIAGNOSIS — R059 Cough, unspecified: Secondary | ICD-10-CM

## 2020-12-15 DIAGNOSIS — R479 Unspecified speech disturbances: Secondary | ICD-10-CM

## 2020-12-15 DIAGNOSIS — F419 Anxiety disorder, unspecified: Secondary | ICD-10-CM

## 2020-12-15 DIAGNOSIS — M79642 Pain in left hand: Secondary | ICD-10-CM

## 2020-12-15 DIAGNOSIS — N941 Unspecified dyspareunia: Secondary | ICD-10-CM

## 2020-12-15 NOTE — Progress Notes (Signed)
Patient ID: Whitney Wong, female    DOB: 1996-08-11, 25 y.o.   MRN: [id number]  PCP: Danelle Berry, PA-C  Chief Complaint  Patient presents with  . Joint Pain    Recovering addict    Subjective:   Whitney Wong is a 25 y.o. female, presents to clinic with CC of the following:  HPI  Multiple acute sx over the past 1-2 months She has gotten off methadone in the past 2 months, withdrawal sx improved, but she has developed bilateral hand weakness, joint pain, slurred speech, decreased appetite and some weight loss  Weight loss - down 7 lbs in the past 2-3 months Pt records on her phone states her weight was nearly 200 195 weightloss 30+ lbs Wt Readings from Last 5 Encounters:  12/15/20 164 lb 9.6 oz (74.7 kg)  09/29/20 171 lb (77.6 kg)  10/16/19 157 lb (71.2 kg)  09/14/19 164 lb 9.6 oz (74.7 kg)   BMI Readings from Last 5 Encounters:  12/15/20 25.78 kg/m  09/29/20 27.19 kg/m  10/16/19 25.34 kg/m  09/14/19 25.78 kg/m   RHD - witting more - nothing with both hands repetitive or lifting, sx have moved from right hand to both hands worse with cold weather    Patient Active Problem List   Diagnosis Date Noted  . 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 Constipation 10/17/2019  . Anxiety disorder 10/16/2019  . Severe episode of recurrent major depressive disorder, without psychotic features (HCC) 10/16/2019  . No-show for appointment 10/08/2019      Current Outpatient Medications:  .  baclofen (LIORESAL) 10 MG tablet, Take 1 tablet (10 mg total) by mouth 3 (three) times daily as needed for muscle spasms. (Patient not taking: Reported on 12/15/2020), Disp: 30 each, Rfl: 2 .  dicyclomine (BENTYL) 20 MG tablet, Take 1 tablet (20 mg total) by mouth 3 (three) times daily as needed for spasms (abd cramping). (Patient not taking: Reported on 12/15/2020), Disp: 40 tablet, Rfl: 1 .  dicyclomine (BENTYL) 20 MG tablet, Take 1 tablet (20 mg total) by mouth 3 (three) times daily as needed for  spasms (abd cramping). (Patient not taking: Reported on 12/15/2020), Disp: 30 tablet, Rfl: 2 .  hydrOXYzine (ATARAX/VISTARIL) 25 MG tablet, Take 25 mg po TID PRN for anxiety, may also use higher dose 25-100 mg po at bedtime for insomnia, max dose 150 in 24 hours (Patient not taking: Reported on 12/15/2020), Disp: 60 tablet, Rfl: 2   Allergies  Allergen Reactions  . Penicillins Hives     Social History   Tobacco Use  . Smoking status: Current Every Day Smoker    Packs/day: 0.25    Types: Cigarettes  . Smokeless tobacco: Never Used  Vaping Use  . Vaping Use: Never used  Substance Use Topics  . Alcohol use: Never  . Drug use: Not Currently    Types: Heroin    Comment: methadone 120mg  daily [facility]      Chart Review Today: I personally reviewed active problem list, medication list, allergies, family history, social history, health maintenance, notes from last encounter, lab results, imaging with the patient/caregiver today.   Review of Systems  Constitutional: Negative.   HENT: Negative.   Eyes: Negative.   Respiratory: Negative.   Cardiovascular: Negative.   Gastrointestinal: Negative.   Endocrine: Negative.   Genitourinary: Negative.   Musculoskeletal: Negative.   Skin: Negative.   Allergic/Immunologic: Negative.   Neurological: Negative.   Hematological: Negative.   Psychiatric/Behavioral: Negative.  All other systems reviewed and are negative.      Objective:   Vitals:   12/15/20 1115  BP: 122/70  Pulse: 97  Temp: 98.4 F (36.9 C)  SpO2: 99%  Weight: 164 lb 9.6 oz (74.7 kg)  Height: 5\' 7"  (1.702 m)    Body mass index is 25.78 kg/m.  Physical Exam Vitals and nursing note reviewed.  Constitutional:      General: She is not in acute distress.    Appearance: Normal appearance. She is well-developed. She is not ill-appearing, toxic-appearing or diaphoretic.     Interventions: Face mask in place.  HENT:     Head: Normocephalic and  atraumatic.     Right Ear: External ear normal.     Left Ear: External ear normal.  Eyes:     General: Lids are normal. No scleral icterus.       Right eye: No discharge.        Left eye: No discharge.     Conjunctiva/sclera: Conjunctivae normal.  Neck:     Trachea: Phonation normal. No tracheal deviation.  Cardiovascular:     Rate and Rhythm: Normal rate and regular rhythm.     Pulses: Normal pulses.          Radial pulses are 2+ on the right side and 2+ on the left side.       Posterior tibial pulses are 2+ on the right side and 2+ on the left side.     Heart sounds: Normal heart sounds. No murmur heard. No friction rub. No gallop.   Pulmonary:     Effort: Pulmonary effort is normal. No respiratory distress.     Breath sounds: Normal breath sounds. No stridor. No wheezing, rhonchi or rales.  Chest:     Chest wall: No tenderness.  Abdominal:     General: Bowel sounds are normal. There is no distension.     Palpations: Abdomen is soft.  Musculoskeletal:     Right wrist: Normal. No swelling, deformity or crepitus. Normal range of motion.     Left wrist: Normal. No swelling, deformity or crepitus. Normal range of motion.     Right hand: No swelling or deformity. Normal capillary refill. Normal pulse.     Left hand: No swelling or deformity. Normal capillary refill. Normal pulse.     Right lower leg: No edema.     Left lower leg: No edema.  Skin:    General: Skin is warm and dry.     Capillary Refill: Capillary refill takes less than 2 seconds.     Coloration: Skin is not ashen, cyanotic, jaundiced, mottled or pale.     Findings: No bruising, ecchymosis, erythema, signs of injury, petechiae or rash.     Nails: There is no clubbing.  Neurological:     Mental Status: She is alert.     Sensory: No sensory deficit.     Motor: No weakness, tremor or abnormal muscle tone.     Coordination: Coordination is intact.     Gait: Gait is intact. Gait normal.  Psychiatric:        Mood and  Affect: Mood normal.        Speech: Speech normal.        Behavior: Behavior normal.      Results for orders placed or performed in visit on 09/14/19  Cervicovaginal ancillary only  Result Value Ref Range   Neisseria Gonorrhea Negative    Chlamydia Negative    Trichomonas Negative  Bacterial Vaginitis (gardnerella) Positive (A)    Candida Vaginitis Negative    Candida Glabrata Negative    Comment      Normal Reference Range Bacterial Vaginosis - Negative   Comment Normal Reference Range Candida Species - Negative    Comment Normal Reference Range Candida Galbrata - Negative    Comment Normal Reference Range Trichomonas - Negative    Comment Normal Reference Ranger Chlamydia - Negative    Comment      Normal Reference Range Neisseria Gonorrhea - Negative       Assessment & Plan:     ICD-10-CM   1. Encounter for hepatitis C screening test for low risk patient  Z11.59 Hepatitis C Antibody  2. Screening for HIV without presence of risk factors  Z11.4 HIV antibody (with reflex)  3. Bilateral hand pain  M79.641 CBC with Differential/Platelet   M79.642 COMPLETE METABOLIC PANEL WITH GFR    TSH  4. Weakness of both hands  R29.898 CBC with Differential/Platelet    COMPLETE METABOLIC PANEL WITH GFR    TSH  5. Paresthesias  R20.2 CBC with Differential/Platelet    COMPLETE METABOLIC PANEL WITH GFR    Hemoglobin A1c    TSH    Iron, TIBC and Ferritin Panel    B12 and Folate Panel  6. Speech disturbance, unspecified type  R47.9 CBC with Differential/Platelet    COMPLETE METABOLIC PANEL WITH GFR    Hemoglobin A1c    TSH    Iron, TIBC and Ferritin Panel    B12 and Folate Panel  7. Weight loss, unintentional  R63.4 CBC with Differential/Platelet    COMPLETE METABOLIC PANEL WITH GFR    Hemoglobin A1c    TSH  8. Near syncope  R55 CBC with Differential/Platelet    COMPLETE METABOLIC PANEL WITH GFR    Hemoglobin A1c    TSH    Iron, TIBC and Ferritin Panel    B12 and Folate Panel   9. Family history of diabetes mellitus (DM)  Z83.3 Hemoglobin A1c  10. Cough  R05.9 DG Chest 2 View  11. Pelvic pain  R10.2 Ambulatory referral to Obstetrics / Gynecology  12. History of ovarian cyst  Z87.42 Ambulatory referral to Obstetrics / Gynecology  13. Dyspareunia in female  N94.10 Ambulatory referral to Obstetrics / Gynecology  14. Anxiety disorder, unspecified type  F41.9 Ambulatory referral to Psychiatry  15. Foot pain, left  M79.672 Ambulatory referral to Podiatry        Danelle Berry, PA-C 12/15/20 11:28 AM

## 2020-12-15 NOTE — Telephone Encounter (Signed)
[facility] referring for worsening pelvic pain and hx of ovarian cyst with surgery in 2010 Currently sexually active, pain with sex. Called and left voicemail for patient to call back to be scheduled.

## 2020-12-16 ENCOUNTER — Encounter: Payer: Self-pay | Admitting: Family Medicine

## 2020-12-16 LAB — COMPLETE METABOLIC PANEL WITH GFR
AG Ratio: 2 (calc) (ref 1.0–2.5)
ALT: 11 U/L (ref 6–29)
AST: 12 U/L (ref 10–30)
Albumin: 4.9 g/dL (ref 3.6–5.1)
Alkaline phosphatase (APISO): 67 U/L (ref 31–125)
BUN: 16 mg/dL (ref 7–25)
CO2: 29 mmol/L (ref 20–32)
Calcium: 10 mg/dL (ref 8.6–10.2)
Chloride: 105 mmol/L (ref 98–110)
Creat: 0.63 mg/dL (ref 0.50–1.10)
GFR, Est African American: 143 mL/min/{1.73_m2} (ref >=60)
GFR, Est Non African American: 124 mL/min/{1.73_m2} (ref >=60)
Globulin: 2.4 g/dL (calc) (ref 1.9–3.7)
Glucose, Bld: 88 mg/dL (ref 65–99)
Potassium: 3.8 mmol/L (ref 3.5–5.3)
Sodium: 140 mmol/L (ref 135–146)
Total Bilirubin: 0.7 mg/dL (ref 0.2–1.2)
Total Protein: 7.3 g/dL (ref 6.1–8.1)

## 2020-12-16 LAB — HEPATITIS C ANTIBODY
Hepatitis C Ab: NONREACTIVE
SIGNAL TO CUT-OFF: 0 (ref <1.00)

## 2020-12-16 LAB — IRON,TIBC AND FERRITIN PANEL
%SAT: 31 % (calc) (ref 16–45)
Ferritin: 34 ng/mL (ref 16–154)
Iron: 110 ug/dL (ref 40–190)
TIBC: 352 mcg/dL (calc) (ref 250–450)

## 2020-12-16 LAB — CBC WITH DIFFERENTIAL/PLATELET
Absolute Monocytes: 510 cells/uL (ref 200–950)
Basophils Absolute: 39 cells/uL (ref 0–200)
Basophils Relative: 0.4 %
Eosinophils Absolute: 29 cells/uL (ref 15–500)
Eosinophils Relative: 0.3 %
HCT: 42.2 % (ref 35.0–45.0)
Hemoglobin: 14.5 g/dL (ref 11.7–15.5)
Lymphs Abs: 2244 cells/uL (ref 850–3900)
MCH: 32.2 pg (ref 27.0–33.0)
MCHC: 34.4 g/dL (ref 32.0–36.0)
MCV: 93.6 fL (ref 80.0–100.0)
MPV: 10.9 fL (ref 7.5–12.5)
Monocytes Relative: 5.2 %
Neutro Abs: 6978 cells/uL (ref 1500–7800)
Neutrophils Relative %: 71.2 %
Platelets: 236 10*3/uL (ref 140–400)
RBC: 4.51 10*6/uL (ref 3.80–5.10)
RDW: 12.7 % (ref 11.0–15.0)
Total Lymphocyte: 22.9 %
WBC: 9.8 10*3/uL (ref 3.8–10.8)

## 2020-12-16 LAB — TSH: TSH: 0.87 mIU/L

## 2020-12-16 LAB — HEMOGLOBIN A1C
Hgb A1c MFr Bld: 5 % of total Hgb (ref <5.7)
Mean Plasma Glucose: 97 mg/dL
eAG (mmol/L): 5.4 mmol/L

## 2020-12-16 LAB — HIV ANTIBODY (ROUTINE TESTING W REFLEX): HIV 1&2 Ab, 4th Generation: NONREACTIVE

## 2020-12-16 LAB — B12 AND FOLATE PANEL
Folate: 9.4 ng/mL
Vitamin B-12: 297 pg/mL (ref 200–1100)

## 2020-12-17 ENCOUNTER — Other Ambulatory Visit (HOSPITAL_COMMUNITY)
Admission: RE | Admit: 2020-12-17 | Discharge: 2020-12-17 | Disposition: A | Discharge status: Home / Self Care | Payer: Medicaid Other | Source: Ambulatory Visit | Attending: Obstetrics and Gynecology | Admitting: Obstetrics and Gynecology

## 2020-12-17 ENCOUNTER — Encounter: Payer: Self-pay | Admitting: Obstetrics and Gynecology

## 2020-12-17 ENCOUNTER — Ambulatory Visit (INDEPENDENT_AMBULATORY_CARE_PROVIDER_SITE_OTHER): Payer: Medicaid Other | Admitting: Obstetrics and Gynecology

## 2020-12-17 ENCOUNTER — Other Ambulatory Visit: Payer: Self-pay

## 2020-12-17 VITALS — BP 115/73 | Ht 67.0 in | Wt 160.0 lb

## 2020-12-17 DIAGNOSIS — N941 Unspecified dyspareunia: Secondary | ICD-10-CM

## 2020-12-17 DIAGNOSIS — Z124 Encounter for screening for malignant neoplasm of cervix: Secondary | ICD-10-CM

## 2020-12-17 DIAGNOSIS — Z8742 Personal history of other diseases of the female genital tract: Secondary | ICD-10-CM | POA: Diagnosis not present

## 2020-12-17 DIAGNOSIS — R102 Pelvic and perineal pain: Secondary | ICD-10-CM | POA: Diagnosis not present

## 2020-12-17 DIAGNOSIS — Z113 Encounter for screening for infections with a predominantly sexual mode of transmission: Secondary | ICD-10-CM | POA: Insufficient documentation

## 2020-12-17 NOTE — Progress Notes (Signed)
Obstetrics & Gynecology Office Visit   Chief Complaint:  Chief Complaint  Patient presents with  . Pelvic Pain    Hx of ovarian cysts   The patient is seen in referral at the request of Sander Radon from [facility] for pelvic pain, dyspareunia, history of ovarian cysts.   History of Present Illness: 25 y.o. G93P1001 female who is seen in referral from Danelle Berry, PA-C from [facility] for pelvic pain, dyspareunia, history of ovarian cysts.    The patient thinks she might have an ovarian cyst of cysts.  In 2010 she had gallbladder surgery, it was noted that she had an ovarian cyst where she had a twisted tube.  She states that the cyst was removed and the tube was saved.  The symptoms she has now are similar.  The symptoms have been present for a while (2 months).  She notes that she is always tired.  Sometimes her hands get sweaty.  She had a dull pain in her lower abdomen. The pain is mostly on her right side.  She also has had pain in the middle.  She rates the pain 6/10. The pain does not radiate.  Alleviating factors: sitting in a different position, pushing down on the area, taking a bath.  Aggravating: certain sitting positions, intercourse, when she has to bowel movement and she has to valsalva.  Associated symptoms: none.   Dyspareunia:  This has been present for about 1 month. The position doesn't matter. The pain is on the right side.  More associated with deeper penetration.  She does have pain after intercourse.  She states that she is having issues with her marriage.    She states that she was told by her OB/GYN in [place] that she had endometriosis or an endometrioma.  This was in about 2017.    Notably, she has been off methadone (prescribed) for about 2 months.    Last pap smear (per report 09/2018) - ?normal No recent imaging.      Past Medical History:  Diagnosis Date  . Anxiety   . Depression   . Ovarian cyst   . Tobacco  dependence     Past Surgical History:  Procedure Laterality Date  . CHOLECYSTECTOMY    . OVARIAN CYST REMOVAL      Gynecologic History: Patient's last menstrual period was 11/21/2020.  Obstetric History: G1P1001, SVD x 1 female weight 6lbs (05/19/2019)  Family History  Problem Relation Age of Onset  . COPD Mother   . Heart failure Mother   . Lupus Mother   . Osteoporosis Mother   . GI problems Mother   . Anxiety disorder Mother   . Depression Mother   . Hyperlipidemia Father   . Schizophrenia Sister   . Hypertension Brother   . Diabetes Maternal Grandmother   . Syncope episode Maternal Grandmother   . Neuropathy Maternal Grandmother   . Heart failure Maternal Grandfather   . ADD / ADHD Sister   . Diabetes Sister        pre    Social History   Socioeconomic History  . Marital status: Married    Spouse name: Not on file  . Number of children: 1  . Years of education: Not on file  . Highest education level: Not on file  Occupational History  . Not on file  Tobacco Use  . Smoking status: Current Every Day Smoker    Packs/day: 0.25    Types: Cigarettes  .  Smokeless tobacco: Never Used  Vaping Use  . Vaping Use: Never used  Substance and Sexual Activity  . Alcohol use: Never  . Drug use: Not Currently    Types: Heroin, Marijuana    Comment: methadone 120mg  daily [facility]  . Sexual activity: Yes    Birth control/protection: None    Comment: husband  Other Topics Concern  . Not on file  Social History Narrative  . Not on file   Social Determinants of Health   Financial Resource Strain: Not on file  Food Insecurity: Not on file  Transportation Needs: Not on file  Physical Activity: Not on file  Stress: Not on file  Social Connections: Not on file  Intimate Partner Violence: Not on file    Allergies  Allergen Reactions  . Penicillins Hives    Prior to Admission medications   MVI    Review of Systems  Constitutional: Negative.    HENT: Negative.   Eyes: Negative.   Respiratory: Negative.   Cardiovascular: Negative.   Gastrointestinal: Negative.   Genitourinary: Negative.   Musculoskeletal: Negative.   Skin: Negative.   Neurological: Negative.   Psychiatric/Behavioral: Negative.      Physical Exam BP 115/73   Ht 5\' 7"  (1.702 m)   Wt 160 lb (72.6 kg)   LMP 11/21/2020   BMI 25.06 kg/m  Patient's last menstrual period was 11/21/2020. Physical Exam Constitutional:      General: She is not in acute distress.    Appearance: Normal appearance. She is well-developed.  Genitourinary:     Vulva, bladder and urethral meatus normal.     No lesions in the vagina.     Right Labia: No rash, tenderness, lesions, skin changes or Bartholin's cyst.    Left Labia: No tenderness, skin changes, Bartholin's cyst or rash.    No inguinal adenopathy present in the right or left side.    Pelvic Tanner Score: 5/5.    Vaginal discharge present.     No vaginal erythema or bleeding.      Right Adnexa: tender.    Right Adnexa: not full and no mass present.    Left Adnexa: tender.    Left Adnexa: not full and no mass present.    No cervical motion tenderness, friability, lesion or polyp.     Uterus is not enlarged, fixed or tender.     Uterus is anteverted.     No urethral tenderness or mass present.     Pelvic exam was performed with patient in the lithotomy position.  HENT:     Head: Normocephalic and atraumatic.  Eyes:     General: No scleral icterus.    Conjunctiva/sclera: Conjunctivae normal.  Cardiovascular:     Rate and Rhythm: Normal rate and regular rhythm.     Heart sounds: No murmur heard. No friction rub. No gallop.   Pulmonary:     Effort: Pulmonary effort is normal. No respiratory distress.     Breath sounds: Normal breath sounds. No wheezing or rales.  Abdominal:     General: Bowel sounds are normal. There is no distension.     Palpations: Abdomen is soft. There is no mass.     Tenderness: There is  abdominal tenderness (RLQ). There is no guarding or rebound.     Hernia: There is no hernia in the left inguinal area or right inguinal area.  Musculoskeletal:        General: Normal range of motion.     Cervical  back: Normal range of motion and neck supple.  Lymphadenopathy:     Lower Body: No right inguinal adenopathy. No left inguinal adenopathy.  Neurological:     General: No focal deficit present.     Mental Status: She is alert and oriented to person, place, and time.     Cranial Nerves: No cranial nerve deficit.  Skin:    General: Skin is warm and dry.     Findings: No erythema.  Psychiatric:        Mood and Affect: Mood normal.        Behavior: Behavior normal.        Judgment: Judgment normal.     Female chaperone present for pelvic and breast  portions of the physical exam  Assessment: 25 y.o. G1P1001 female here for  1. Pelvic pain   2. Dyspareunia in female   3. History of ovarian cyst   4. Screen for STD (sexually transmitted disease)   5. Pap smear for cervical cancer screening      Plan: Problem List Items Addressed This Visit   None   Visit Diagnoses    Pelvic pain    -  Primary   Relevant Orders   US PELVIS (TRANSABDOMINAL ONLY)   US PELVIS TRANSVAGINAL NON-OB (TV ONLY)   Cytology - PAP   Dyspareunia in female       Relevant Orders   US PELVIS (TRANSABDOMINAL ONLY)   US PELVIS TRANSVAGINAL NON-OB (TV ONLY)   History of ovarian cyst       Relevant Orders   US PELVIS (TRANSABDOMINAL ONLY)   US PELVIS TRANSVAGINAL NON-OB (TV ONLY)   Screen for STD (sexually transmitted disease)       Relevant Orders   Cytology - PAP   Pap smear for cervical cancer screening       Relevant Orders   Cytology - PAP     For pelvic pain and dyspareunia, will get pelvic ultrasound and will send pap smear with STI screening today.  For history of ovarian cyst, will also get pelvic ultrasound.  A total of 33 minutes were spent face-to-face with the patient as well as  preparation, review, communication, and documentation during this encounter.    Thomasene Mohair, MD 12/17/2020 3:31 PM    CC: Danelle Berry, PA-C [address] [place],  [place] [address]

## 2020-12-22 ENCOUNTER — Telehealth: Payer: Self-pay

## 2020-12-22 LAB — CYTOLOGY - PAP
Chlamydia: NEGATIVE
Comment: NEGATIVE
Comment: NEGATIVE
Comment: NORMAL
Diagnosis: NEGATIVE
Neisseria Gonorrhea: NEGATIVE
Trichomonas: NEGATIVE

## 2020-12-22 NOTE — Telephone Encounter (Signed)
Patient is calling for labs results. Please advise. 

## 2020-12-23 NOTE — Telephone Encounter (Signed)
Please let me know when pt callsback ?

## 2020-12-24 ENCOUNTER — Encounter: Payer: Self-pay | Admitting: Obstetrics and Gynecology

## 2020-12-24 ENCOUNTER — Ambulatory Visit (INDEPENDENT_AMBULATORY_CARE_PROVIDER_SITE_OTHER): Payer: Medicaid Other

## 2020-12-24 ENCOUNTER — Ambulatory Visit (INDEPENDENT_AMBULATORY_CARE_PROVIDER_SITE_OTHER): Payer: Medicaid Other | Admitting: Obstetrics and Gynecology

## 2020-12-24 ENCOUNTER — Other Ambulatory Visit: Payer: Self-pay

## 2020-12-24 VITALS — BP 109/69 | Ht 67.0 in | Wt 165.0 lb

## 2020-12-24 DIAGNOSIS — N941 Unspecified dyspareunia: Secondary | ICD-10-CM

## 2020-12-24 DIAGNOSIS — Z8742 Personal history of other diseases of the female genital tract: Secondary | ICD-10-CM | POA: Diagnosis not present

## 2020-12-24 DIAGNOSIS — R102 Pelvic and perineal pain: Secondary | ICD-10-CM

## 2020-12-24 MED ORDER — NORETHIN ACE-ETH ESTRAD-FE 1-20 MG-MCG PO TABS: 1.0000 | ORAL_TABLET | Freq: Every day | ORAL | 4 refills | Status: DC

## 2020-12-24 NOTE — Progress Notes (Signed)
Gynecology Ultrasound Follow Up   Chief Complaint  Patient presents with  . Follow-up    U/s follow up, pelvic pain  . Pelvic Pain  dysmenorrhea   History of Present Illness: Patient is a 25 y.o. female who presents today for ultrasound evaluation of the above .  Ultrasound demonstrates the following findings Adnexa: no masses seen  Uterus: anteverted with endometrial stripe  2.6 mm Additional: no abnormalities. No ovarian cysts. No free fluid.   She had a period since her last visit and states the pain was terrible with a period that started 4 days ago.  She also notes growth of body hair in places she normally does have them (face and chest).   She states she was recently diagnosed with a vitamin B12 deficiency.    Past Medical History:  Diagnosis Date  . Anxiety   . Depression   . Ovarian cyst   . Tobacco dependence     Past Surgical History:  Procedure Laterality Date  . CHOLECYSTECTOMY    . OVARIAN CYST REMOVAL      Family History  Problem Relation Age of Onset  . COPD Mother   . Heart failure Mother   . Lupus Mother   . Osteoporosis Mother   . GI problems Mother   . Anxiety disorder Mother   . Depression Mother   . Hyperlipidemia Father   . Schizophrenia Sister   . Hypertension Brother   . Diabetes Maternal Grandmother   . Syncope episode Maternal Grandmother   . Neuropathy Maternal Grandmother   . Heart failure Maternal Grandfather   . ADD / ADHD Sister   . Diabetes Sister        pre    Social History   Socioeconomic History  . Marital status: Married    Spouse name: Not on file  . Number of children: 1  . Years of education: Not on file  . Highest education level: Not on file  Occupational History  . Not on file  Tobacco Use  . Smoking status: Current Every Day Smoker    Packs/day: 0.25    Types: Cigarettes  . Smokeless tobacco: Never Used  Vaping Use  . Vaping Use: Never used  Substance and Sexual Activity  . Alcohol use: Never  .  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 Sexual activity: Yes    Birth control/protection: None    Comment: husband  Other Topics Concern  . Not on file  Social History Narrative  . Not on file   Social Determinants of Health   Financial Resource Strain: Not on file  Food Insecurity: Not on file  Transportation Needs: Not on file  Physical Activity: Not on file  Stress: Not on file  Social Connections: Not on file  Intimate Partner Violence: Not on file    Allergies  Allergen Reactions  . Penicillins Hives    Prior to Admission medications   Denies    Physical Exam BP 109/69   Ht 5\' 7"  (1.702 m)   Wt 165 lb (74.8 kg)   LMP 12/17/2020   BMI 25.84 kg/m    General: NAD HEENT: normocephalic, anicteric Pulmonary: No increased work of breathing Extremities: no edema, erythema, or tenderness Neurologic: Grossly intact, normal gait Psychiatric: mood appropriate, affect full  Imaging Results PELVIS TRANSVAGINAL NON-OB (TV ONLY)  Result Date: 12/24/2020 Patient Name: Whitney Wong DOB: June 25, 1996 MRN: Erskine Speed  ULTRASOUND REPORT Location: [facility] Date of Service: 12/24/2020 Indications:Pelvic Pain Findings: The uterus is anteverted and measures 7.1 x 4.5 x 3.4 cm. Echo texture is homogenous without evidence of focal masses. The Endometrium measures 2.6 mm. Right Ovary measures 3.3 x 2.1 x 1.8 cm. It is normal in appearance. Left Ovary measures 3.1 x 1.5 x 2.1 cm. It is normal in appearance. Survey of the adnexa demonstrates no adnexal masses. There is no free fluid in the cul de sac. Impression: 1. Normal pelvic ultrasound. Deanna Artis, RT The ultrasound images and findings were reviewed by me and I agree with the above report. Thomasene Mohair, MD, [facility], [facility] 12/24/2020 11:03 AM      Assessment: 25 y.o. G1P1001  1. Pelvic pain   2. History of  ovarian cyst   3. Dyspareunia in female      Plan: Problem List Items Addressed This Visit   None   Visit Diagnoses    Pelvic pain    -  Primary   Relevant Medications   norethindrone-ethinyl estradiol (JUNEL FE 1/20) 1-20 MG-MCG tablet   History of ovarian cyst       Relevant Medications   norethindrone-ethinyl estradiol (JUNEL FE 1/20) 1-20 MG-MCG tablet   Dyspareunia in female       Relevant Medications   norethindrone-ethinyl estradiol (JUNEL FE 1/20) 1-20 MG-MCG tablet     Revisited causes of pelvic pain, including endometriosis.  She had a normal pap smear and negative STI screening.  After discussion of diagnostic and treatment options, will start on combined OCPs and follow up in 3 months.   A total of 21 minutes were spent face-to-face with the patient as well as preparation, review, communication, and documentation during this encounter.    Thomasene Mohair, MD, [facility], [facility] 12/24/2020 11:15 AM

## 2020-12-24 NOTE — Telephone Encounter (Signed)
Pt coming in today. I have not been able to get her on the phone. Has appt today with SDJ

## 2020-12-31 ENCOUNTER — Encounter: Payer: Self-pay | Admitting: Podiatry

## 2020-12-31 ENCOUNTER — Other Ambulatory Visit: Payer: Self-pay

## 2020-12-31 ENCOUNTER — Ambulatory Visit (INDEPENDENT_AMBULATORY_CARE_PROVIDER_SITE_OTHER): Payer: Medicaid Other

## 2020-12-31 ENCOUNTER — Ambulatory Visit (INDEPENDENT_AMBULATORY_CARE_PROVIDER_SITE_OTHER): Payer: Medicaid Other | Admitting: Podiatry

## 2020-12-31 DIAGNOSIS — M7742 Metatarsalgia, left foot: Secondary | ICD-10-CM

## 2020-12-31 DIAGNOSIS — M779 Enthesopathy, unspecified: Secondary | ICD-10-CM | POA: Diagnosis not present

## 2020-12-31 DIAGNOSIS — M216X2 Other acquired deformities of left foot: Secondary | ICD-10-CM

## 2020-12-31 DIAGNOSIS — M216X1 Other acquired deformities of right foot: Secondary | ICD-10-CM | POA: Diagnosis not present

## 2020-12-31 DIAGNOSIS — M7741 Metatarsalgia, right foot: Secondary | ICD-10-CM

## 2020-12-31 DIAGNOSIS — M21862 Other specified acquired deformities of left lower leg: Secondary | ICD-10-CM

## 2020-12-31 DIAGNOSIS — Q667 Congenital pes cavus, unspecified foot: Secondary | ICD-10-CM

## 2020-12-31 DIAGNOSIS — M21861 Other specified acquired deformities of right lower leg: Secondary | ICD-10-CM

## 2021-01-04 ENCOUNTER — Encounter: Payer: Self-pay | Admitting: Podiatry

## 2021-01-04 NOTE — Progress Notes (Signed)
  Subjective:  Patient ID: Whitney Wong, female    DOB: 04-26-1996,  MRN: [id number]  Chief Complaint  Patient presents with  . Foot Pain    Patient presents today for bilat forefeet pain x years, progressively getting worse over the past several months.  She says it feels like pins and needles    25 y.o. female presents with the above complaint. History confirmed with patient. Was born with macrodactyly. Does not know if she had surgery. Most of the pain is in the arch and feels like cramping, pins and needles. Locates it to the submetatarsal area towards the end of the day at work  Objective:  Physical Exam: warm, good capillary refill, no trophic changes or ulcerative lesions, normal DP and PT pulses and normal sensory exam. Bilaterally she has mild splay foot and pes cavus foot type. Toes appear to be somewhat hypotrophic.  Radiographs: X-ray of both feet: splay foot with tailor's bunion, mild metatarsus adductus, hypotrophic distal phalanges Assessment:   1. Capsulitis   2. Pes cavus, congenital   3. Metatarsalgia of both feet   4. Gastrocnemius equinus of right lower extremity   5. Gastrocnemius equinus of left lower extremity      Plan:  Patient was evaluated and treated and all questions answered.  Reviewed x-rays and pathomechanics of her current symptoms.  I believe most of this is metatarsalgia secondary to her mild pes cavus.  I think she will benefit from orthotic support.  Prescription was written for orthotics to be made at [facility].  Also dispensed metatarsal pads instructed on use used for relief in the interim.  We will see her back in 3 months see how she is doing.  Return in about 3 months (around 03/30/2021).

## 2021-01-05 ENCOUNTER — Other Ambulatory Visit: Payer: Self-pay | Admitting: Podiatry

## 2021-01-05 DIAGNOSIS — M7742 Metatarsalgia, left foot: Secondary | ICD-10-CM

## 2021-01-05 DIAGNOSIS — M7741 Metatarsalgia, right foot: Secondary | ICD-10-CM

## 2021-01-07 ENCOUNTER — Encounter: Payer: Self-pay | Admitting: Podiatry

## 2021-02-04 NOTE — Telephone Encounter (Signed)
Medicaid does not cover CMO's for adults only kids 18 or under to my knowledge.

## 2021-02-04 NOTE — Telephone Encounter (Signed)
I was under the impression that [facility] will do CMOs for Medicaid patients (not DM shoes/inserts)? Is that just for pediatric patients? I've sent patients there before for them

## 2021-03-30 ENCOUNTER — Ambulatory Visit: Payer: Medicaid Other | Admitting: Podiatry

## 2021-06-07 DIAGNOSIS — Z20822 Contact with and (suspected) exposure to covid-19: Secondary | ICD-10-CM | POA: Diagnosis not present

## 2021-10-04 DIAGNOSIS — L301 Dyshidrosis [pompholyx]: Secondary | ICD-10-CM | POA: Diagnosis not present

## 2021-11-04 ENCOUNTER — Ambulatory Visit: Payer: Medicaid Other | Admitting: Podiatry

## 2021-11-04 ENCOUNTER — Other Ambulatory Visit: Payer: Self-pay

## 2021-11-04 ENCOUNTER — Ambulatory Visit (INDEPENDENT_AMBULATORY_CARE_PROVIDER_SITE_OTHER): Payer: Medicaid Other

## 2021-11-04 DIAGNOSIS — M24471 Recurrent dislocation, right ankle: Secondary | ICD-10-CM | POA: Diagnosis not present

## 2021-11-04 DIAGNOSIS — M7750 Other enthesopathy of unspecified foot: Secondary | ICD-10-CM

## 2021-11-04 DIAGNOSIS — M25371 Other instability, right ankle: Secondary | ICD-10-CM

## 2021-11-04 DIAGNOSIS — M7671 Peroneal tendinitis, right leg: Secondary | ICD-10-CM | POA: Diagnosis not present

## 2021-11-04 NOTE — Progress Notes (Signed)
°  Subjective:  Patient ID: Whitney Wong, female    DOB: June 18, 1996,  MRN: [id number]  Chief Complaint  Patient presents with   Foot Pain    Extreme ankle pain    25 y.o. female returns for follow-up with the above complaint. History confirmed with patient.  She has previously injured the right ankle for and rolled it.  She notes it often gives out and feels unstable when she is on her feet at work all day.  She feels like there is popping in the ankle that always needs to give out.  Objective:  Physical Exam: warm, good capillary refill, no trophic changes or ulcerative lesions, normal DP and PT pulses and normal sensory exam. Bilaterally she has mild splay foot and pes cavus foot type.  Pain lateral ankle under the CFL and ATFL.  Palpation of the posterior ankle and the peroneal tendons.  She has subluxation of the peroneals with circumduction of the ankle  Radiographs: X-ray of both feet: No fracture or any major degenerative changes of the ankle noted Assessment:   1. Right ankle instability   2. Peroneal tendinitis of right lower extremity   3. 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  Reviewed her x-rays and current symptoms with her.  I think she has chronic ankle instability from prior injury and her pes cavus foot type.  Recommended bracing with a compression ankle brace and physical therapy for strengthening and stability.  I gave her a referral for this for [facility] in [place] which she will schedule.  I will see her back in 8 weeks for reevaluation.  We also discussed surgical stabilization of the ankle if is not improving  Return in about 2 months (around 01/05/2022) for recheck ankle instability .

## 2022-01-06 ENCOUNTER — Ambulatory Visit: Payer: Medicaid Other | Admitting: Podiatry

## 2022-02-03 IMAGING — CR DG CHEST 2V
1 series · 2 of 2 positions shown · non-contrast
Comparison: None.

CLINICAL DATA: Cough.  Right anterior chest pain.

EXAM:
CHEST - 2 VIEW

[Series 1: dg chest 2 view · 0.14mm/px · 2 of 2 slices shown]
[im 1/2]
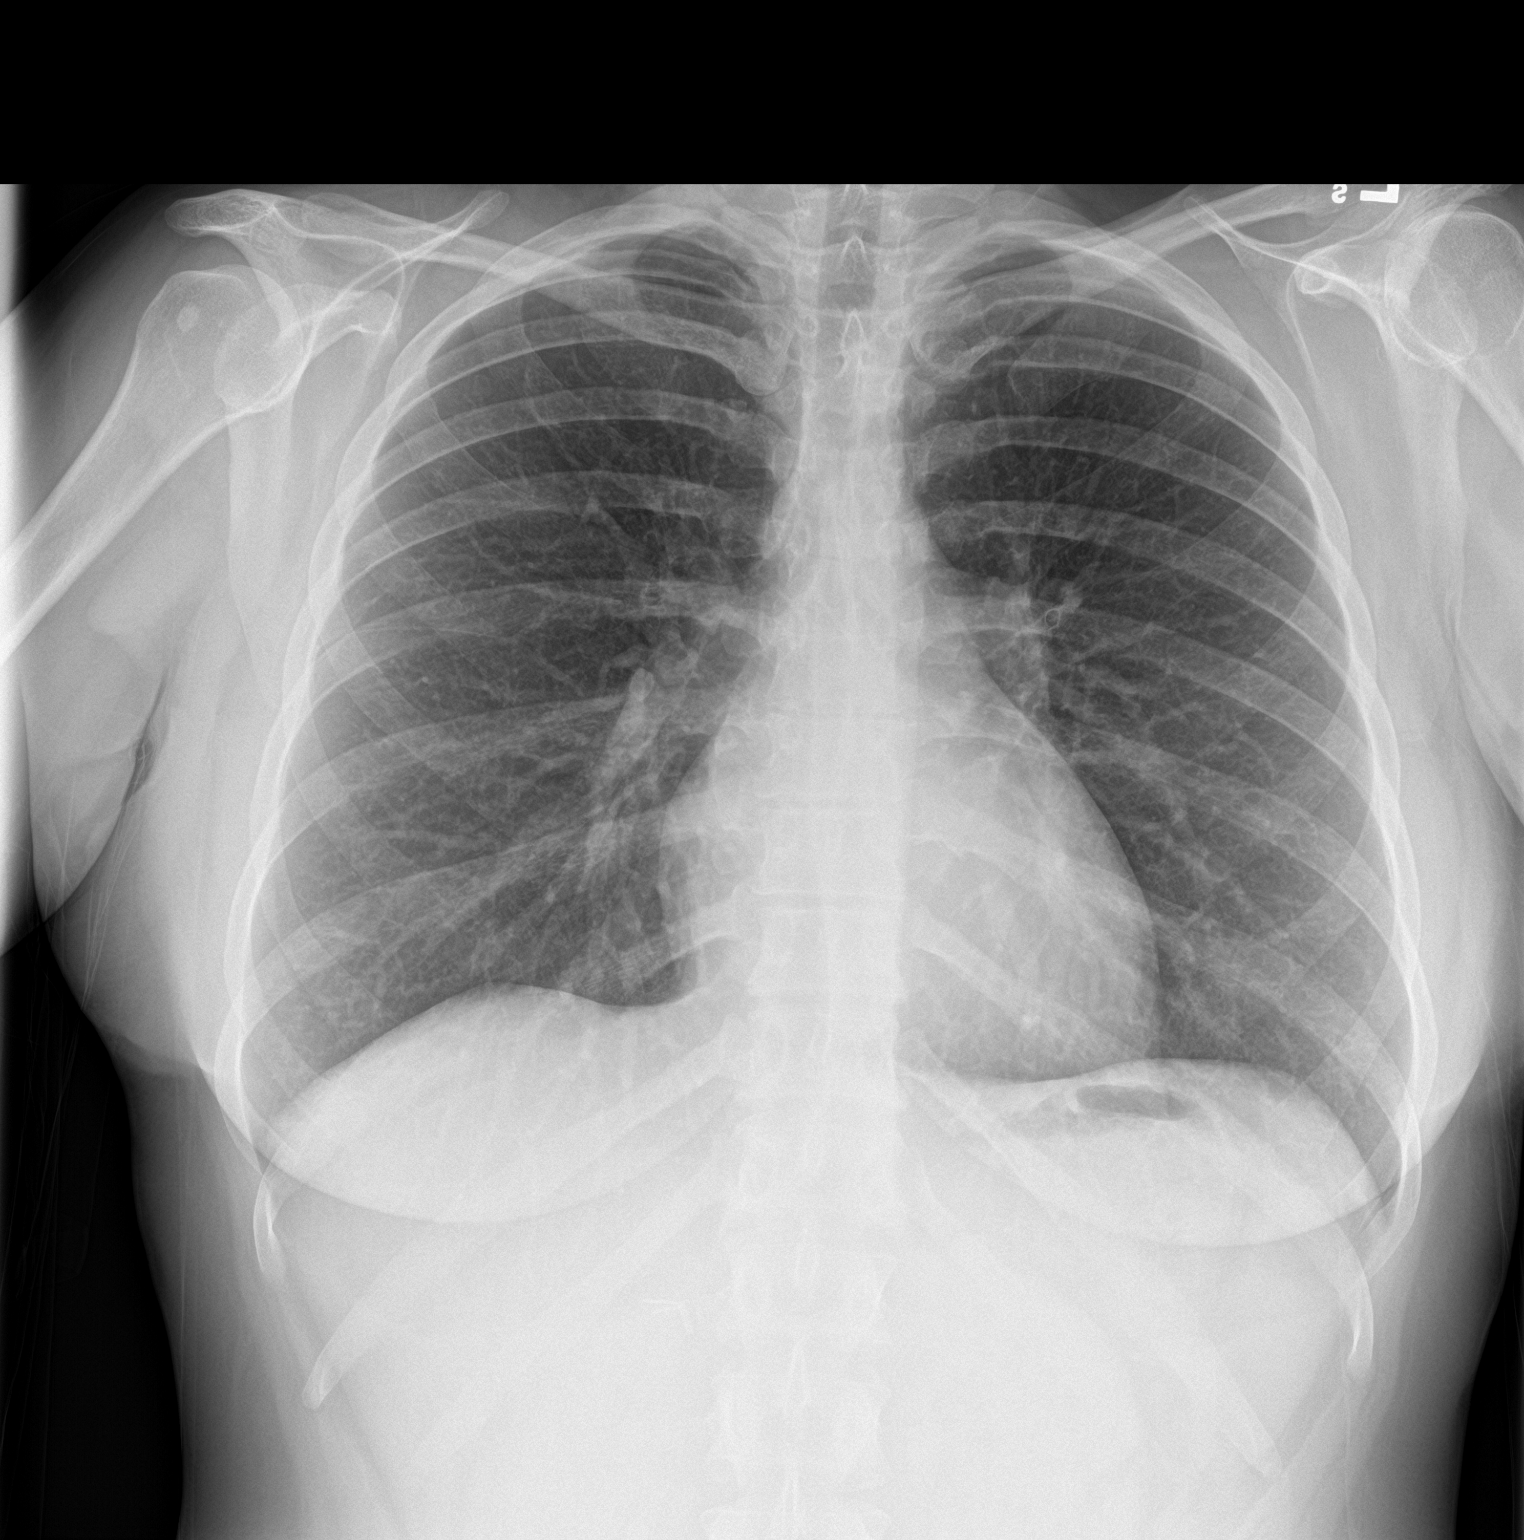
[im 2/2]
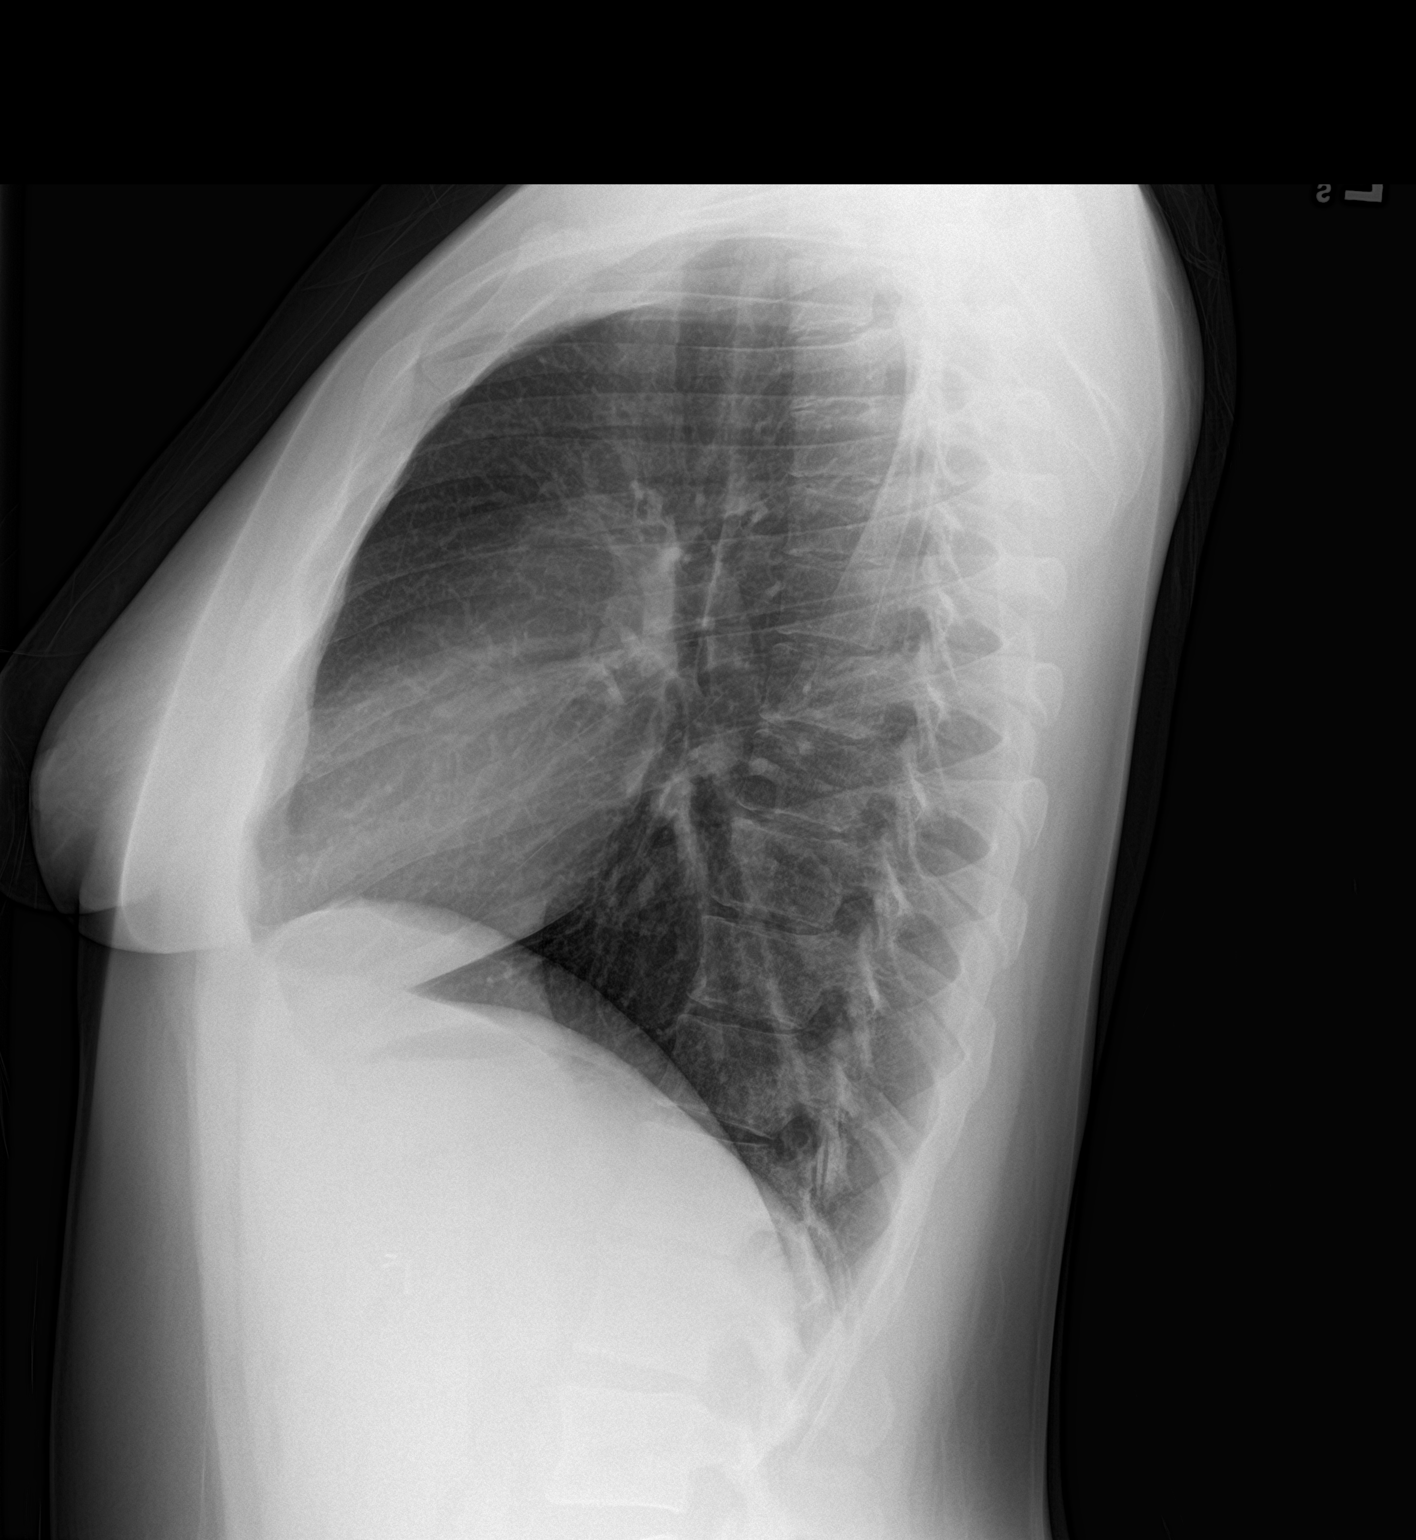

[2 of 2 positions shown; findings below may reference images not displayed]

FINDINGS: The heart size and mediastinal contours are within normal limits.
Both lungs are clear. The visualized skeletal structures are
unremarkable.
IMPRESSION: No active cardiopulmonary disease.

## 2022-10-15 DIAGNOSIS — Z419 Encounter for procedure for purposes other than remedying health state, unspecified: Secondary | ICD-10-CM | POA: Diagnosis not present

## 2022-10-26 DIAGNOSIS — Z7689 Persons encountering health services in other specified circumstances: Secondary | ICD-10-CM | POA: Diagnosis not present

## 2022-10-27 DIAGNOSIS — Z7689 Persons encountering health services in other specified circumstances: Secondary | ICD-10-CM | POA: Diagnosis not present

## 2022-10-28 DIAGNOSIS — Z7689 Persons encountering health services in other specified circumstances: Secondary | ICD-10-CM | POA: Diagnosis not present

## 2022-10-29 DIAGNOSIS — Z7689 Persons encountering health services in other specified circumstances: Secondary | ICD-10-CM | POA: Diagnosis not present

## 2022-10-30 DIAGNOSIS — Z7689 Persons encountering health services in other specified circumstances: Secondary | ICD-10-CM | POA: Diagnosis not present

## 2022-11-01 DIAGNOSIS — Z7689 Persons encountering health services in other specified circumstances: Secondary | ICD-10-CM | POA: Diagnosis not present

## 2022-11-02 DIAGNOSIS — Z7689 Persons encountering health services in other specified circumstances: Secondary | ICD-10-CM | POA: Diagnosis not present

## 2022-11-03 DIAGNOSIS — Z7689 Persons encountering health services in other specified circumstances: Secondary | ICD-10-CM | POA: Diagnosis not present

## 2022-11-04 DIAGNOSIS — Z7689 Persons encountering health services in other specified circumstances: Secondary | ICD-10-CM | POA: Diagnosis not present

## 2022-11-05 DIAGNOSIS — Z7689 Persons encountering health services in other specified circumstances: Secondary | ICD-10-CM | POA: Diagnosis not present

## 2022-11-06 DIAGNOSIS — Z7689 Persons encountering health services in other specified circumstances: Secondary | ICD-10-CM | POA: Diagnosis not present

## 2022-11-09 DIAGNOSIS — Z7689 Persons encountering health services in other specified circumstances: Secondary | ICD-10-CM | POA: Diagnosis not present

## 2022-11-10 DIAGNOSIS — Z7689 Persons encountering health services in other specified circumstances: Secondary | ICD-10-CM | POA: Diagnosis not present

## 2022-11-11 DIAGNOSIS — Z7689 Persons encountering health services in other specified circumstances: Secondary | ICD-10-CM | POA: Diagnosis not present

## 2022-11-12 DIAGNOSIS — Z7689 Persons encountering health services in other specified circumstances: Secondary | ICD-10-CM | POA: Diagnosis not present

## 2022-11-13 DIAGNOSIS — Z7689 Persons encountering health services in other specified circumstances: Secondary | ICD-10-CM | POA: Diagnosis not present

## 2022-11-15 DIAGNOSIS — Z419 Encounter for procedure for purposes other than remedying health state, unspecified: Secondary | ICD-10-CM | POA: Diagnosis not present

## 2022-11-16 DIAGNOSIS — Z7689 Persons encountering health services in other specified circumstances: Secondary | ICD-10-CM | POA: Diagnosis not present

## 2022-11-17 DIAGNOSIS — Z7689 Persons encountering health services in other specified circumstances: Secondary | ICD-10-CM | POA: Diagnosis not present

## 2022-11-18 DIAGNOSIS — Z7689 Persons encountering health services in other specified circumstances: Secondary | ICD-10-CM | POA: Diagnosis not present

## 2022-11-19 DIAGNOSIS — Z7689 Persons encountering health services in other specified circumstances: Secondary | ICD-10-CM | POA: Diagnosis not present

## 2022-11-22 DIAGNOSIS — Z7689 Persons encountering health services in other specified circumstances: Secondary | ICD-10-CM | POA: Diagnosis not present

## 2022-11-23 DIAGNOSIS — Z7689 Persons encountering health services in other specified circumstances: Secondary | ICD-10-CM | POA: Diagnosis not present

## 2022-11-24 DIAGNOSIS — Z7689 Persons encountering health services in other specified circumstances: Secondary | ICD-10-CM | POA: Diagnosis not present

## 2022-11-25 DIAGNOSIS — Z7689 Persons encountering health services in other specified circumstances: Secondary | ICD-10-CM | POA: Diagnosis not present

## 2022-11-26 DIAGNOSIS — Z7689 Persons encountering health services in other specified circumstances: Secondary | ICD-10-CM | POA: Diagnosis not present

## 2022-11-27 DIAGNOSIS — Z7689 Persons encountering health services in other specified circumstances: Secondary | ICD-10-CM | POA: Diagnosis not present

## 2022-11-29 DIAGNOSIS — Z7689 Persons encountering health services in other specified circumstances: Secondary | ICD-10-CM | POA: Diagnosis not present

## 2022-11-30 DIAGNOSIS — Z7689 Persons encountering health services in other specified circumstances: Secondary | ICD-10-CM | POA: Diagnosis not present

## 2022-12-01 DIAGNOSIS — Z7689 Persons encountering health services in other specified circumstances: Secondary | ICD-10-CM | POA: Diagnosis not present

## 2022-12-02 DIAGNOSIS — Z7689 Persons encountering health services in other specified circumstances: Secondary | ICD-10-CM | POA: Diagnosis not present

## 2022-12-03 DIAGNOSIS — Z7689 Persons encountering health services in other specified circumstances: Secondary | ICD-10-CM | POA: Diagnosis not present

## 2022-12-04 DIAGNOSIS — Z7689 Persons encountering health services in other specified circumstances: Secondary | ICD-10-CM | POA: Diagnosis not present

## 2022-12-06 DIAGNOSIS — Z7689 Persons encountering health services in other specified circumstances: Secondary | ICD-10-CM | POA: Diagnosis not present

## 2022-12-07 DIAGNOSIS — Z7689 Persons encountering health services in other specified circumstances: Secondary | ICD-10-CM | POA: Diagnosis not present

## 2022-12-08 DIAGNOSIS — Z7689 Persons encountering health services in other specified circumstances: Secondary | ICD-10-CM | POA: Diagnosis not present

## 2022-12-09 DIAGNOSIS — Z7689 Persons encountering health services in other specified circumstances: Secondary | ICD-10-CM | POA: Diagnosis not present

## 2022-12-10 DIAGNOSIS — Z7689 Persons encountering health services in other specified circumstances: Secondary | ICD-10-CM | POA: Diagnosis not present

## 2022-12-11 DIAGNOSIS — Z7689 Persons encountering health services in other specified circumstances: Secondary | ICD-10-CM | POA: Diagnosis not present

## 2022-12-13 DIAGNOSIS — Z7689 Persons encountering health services in other specified circumstances: Secondary | ICD-10-CM | POA: Diagnosis not present

## 2022-12-14 DIAGNOSIS — Z7689 Persons encountering health services in other specified circumstances: Secondary | ICD-10-CM | POA: Diagnosis not present

## 2022-12-15 DIAGNOSIS — Z7689 Persons encountering health services in other specified circumstances: Secondary | ICD-10-CM | POA: Diagnosis not present

## 2022-12-16 DIAGNOSIS — Z419 Encounter for procedure for purposes other than remedying health state, unspecified: Secondary | ICD-10-CM | POA: Diagnosis not present

## 2022-12-16 DIAGNOSIS — Z7689 Persons encountering health services in other specified circumstances: Secondary | ICD-10-CM | POA: Diagnosis not present

## 2022-12-17 DIAGNOSIS — Z7689 Persons encountering health services in other specified circumstances: Secondary | ICD-10-CM | POA: Diagnosis not present

## 2022-12-18 DIAGNOSIS — Z7689 Persons encountering health services in other specified circumstances: Secondary | ICD-10-CM | POA: Diagnosis not present

## 2022-12-20 DIAGNOSIS — Z7689 Persons encountering health services in other specified circumstances: Secondary | ICD-10-CM | POA: Diagnosis not present

## 2022-12-21 DIAGNOSIS — Z7689 Persons encountering health services in other specified circumstances: Secondary | ICD-10-CM | POA: Diagnosis not present

## 2022-12-22 DIAGNOSIS — Z7689 Persons encountering health services in other specified circumstances: Secondary | ICD-10-CM | POA: Diagnosis not present

## 2022-12-23 DIAGNOSIS — Z7689 Persons encountering health services in other specified circumstances: Secondary | ICD-10-CM | POA: Diagnosis not present

## 2022-12-24 DIAGNOSIS — Z7689 Persons encountering health services in other specified circumstances: Secondary | ICD-10-CM | POA: Diagnosis not present

## 2022-12-25 DIAGNOSIS — Z7689 Persons encountering health services in other specified circumstances: Secondary | ICD-10-CM | POA: Diagnosis not present

## 2022-12-27 DIAGNOSIS — Z7689 Persons encountering health services in other specified circumstances: Secondary | ICD-10-CM | POA: Diagnosis not present

## 2022-12-28 DIAGNOSIS — Z7689 Persons encountering health services in other specified circumstances: Secondary | ICD-10-CM | POA: Diagnosis not present

## 2022-12-29 DIAGNOSIS — Z7689 Persons encountering health services in other specified circumstances: Secondary | ICD-10-CM | POA: Diagnosis not present

## 2022-12-30 DIAGNOSIS — Z7689 Persons encountering health services in other specified circumstances: Secondary | ICD-10-CM | POA: Diagnosis not present

## 2022-12-31 DIAGNOSIS — Z7689 Persons encountering health services in other specified circumstances: Secondary | ICD-10-CM | POA: Diagnosis not present

## 2023-01-01 DIAGNOSIS — Z7689 Persons encountering health services in other specified circumstances: Secondary | ICD-10-CM | POA: Diagnosis not present

## 2023-01-03 DIAGNOSIS — Z7689 Persons encountering health services in other specified circumstances: Secondary | ICD-10-CM | POA: Diagnosis not present

## 2023-01-04 DIAGNOSIS — Z7689 Persons encountering health services in other specified circumstances: Secondary | ICD-10-CM | POA: Diagnosis not present

## 2023-01-05 DIAGNOSIS — Z7689 Persons encountering health services in other specified circumstances: Secondary | ICD-10-CM | POA: Diagnosis not present

## 2023-01-06 DIAGNOSIS — Z7689 Persons encountering health services in other specified circumstances: Secondary | ICD-10-CM | POA: Diagnosis not present

## 2023-01-07 DIAGNOSIS — Z7689 Persons encountering health services in other specified circumstances: Secondary | ICD-10-CM | POA: Diagnosis not present

## 2023-01-08 DIAGNOSIS — Z7689 Persons encountering health services in other specified circumstances: Secondary | ICD-10-CM | POA: Diagnosis not present

## 2023-01-10 DIAGNOSIS — Z7689 Persons encountering health services in other specified circumstances: Secondary | ICD-10-CM | POA: Diagnosis not present

## 2023-01-11 DIAGNOSIS — Z7689 Persons encountering health services in other specified circumstances: Secondary | ICD-10-CM | POA: Diagnosis not present

## 2023-01-12 DIAGNOSIS — Z7689 Persons encountering health services in other specified circumstances: Secondary | ICD-10-CM | POA: Diagnosis not present

## 2023-01-13 DIAGNOSIS — Z7689 Persons encountering health services in other specified circumstances: Secondary | ICD-10-CM | POA: Diagnosis not present

## 2023-01-14 DIAGNOSIS — Z419 Encounter for procedure for purposes other than remedying health state, unspecified: Secondary | ICD-10-CM | POA: Diagnosis not present

## 2023-01-14 DIAGNOSIS — Z7689 Persons encountering health services in other specified circumstances: Secondary | ICD-10-CM | POA: Diagnosis not present

## 2023-01-15 DIAGNOSIS — Z7689 Persons encountering health services in other specified circumstances: Secondary | ICD-10-CM | POA: Diagnosis not present

## 2023-01-17 DIAGNOSIS — Z7689 Persons encountering health services in other specified circumstances: Secondary | ICD-10-CM | POA: Diagnosis not present

## 2023-01-18 DIAGNOSIS — Z7689 Persons encountering health services in other specified circumstances: Secondary | ICD-10-CM | POA: Diagnosis not present

## 2023-01-19 DIAGNOSIS — Z7689 Persons encountering health services in other specified circumstances: Secondary | ICD-10-CM | POA: Diagnosis not present

## 2023-01-20 DIAGNOSIS — Z7689 Persons encountering health services in other specified circumstances: Secondary | ICD-10-CM | POA: Diagnosis not present

## 2023-01-21 DIAGNOSIS — Z7689 Persons encountering health services in other specified circumstances: Secondary | ICD-10-CM | POA: Diagnosis not present

## 2023-03-21 ENCOUNTER — Telehealth: Payer: Self-pay

## 2023-03-21 NOTE — Telephone Encounter (Signed)
Sending mychart msg. AS, CMA 

## 2023-06-22 ENCOUNTER — Ambulatory Visit (INDEPENDENT_AMBULATORY_CARE_PROVIDER_SITE_OTHER): Payer: Medicaid Other | Admitting: Family Medicine

## 2023-06-22 ENCOUNTER — Encounter: Payer: Self-pay | Admitting: Family Medicine

## 2023-06-22 VITALS — BP 104/70 | HR 76 | Temp 97.9°F | Resp 16 | Wt 164.3 lb

## 2023-06-22 DIAGNOSIS — L989 Disorder of the skin and subcutaneous tissue, unspecified: Secondary | ICD-10-CM

## 2023-06-22 NOTE — Progress Notes (Signed)
Patient ID: Whitney Wong, female    DOB: February 27, 1996, 27 y.o.   MRN: [id number]  PCP: Danelle Berry, PA-C  Chief Complaint  Patient presents with   Skin Problem    Mole on head would like referral for dermatology    Subjective:   Whitney Wong is a 27 y.o. 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   Review of Systems  Constitutional: Negative.   HENT: Negative.    Eyes: Negative.   Respiratory: Negative.    Cardiovascular: Negative.   Gastrointestinal: Negative.   Endocrine: Negative.   Genitourinary:  Negative.   Musculoskeletal: Negative.   Skin: Negative.   Allergic/Immunologic: Negative.   Neurological: Negative.   Hematological: Negative.   Psychiatric/Behavioral: Negative.    All other systems reviewed and are negative.      Objective:   Vitals:   06/22/23 1550  BP: 104/70  Pulse: 76  Resp: 16  Temp: 97.9 F (36.6 C)  TempSrc: Oral  SpO2: 99%  Weight: 164 lb 4.8 oz (74.5 kg)    Body mass index is 25.73 kg/m.  Physical Exam Vitals and nursing note reviewed.  Constitutional:      Appearance: She is well-developed.  HENT:     Head: Normocephalic and atraumatic.     Nose: Nose normal.  Eyes:     General:        Right eye: No discharge.        Left eye: No discharge.     Conjunctiva/sclera: Conjunctivae normal.  Neck:     Trachea: No tracheal deviation.  Cardiovascular:     Rate and Rhythm: Normal rate and regular rhythm.  Pulmonary:     Effort: Pulmonary effort is normal. No respiratory distress.     Breath sounds: No stridor.  Musculoskeletal:        General: Normal range of motion.  Skin:    General: Skin is warm and dry.     Findings: Lesion (right scalp, 3mm raised soft fleshy lesion,  no tenderness/edema/erythema) present. No rash.  Neurological:     Mental Status: She is alert.     Motor: No abnormal muscle tone.     Coordination: Coordination normal.  Psychiatric:        Behavior: Behavior normal.      Results for orders placed or performed in visit on 12/17/20  Cytology - PAP  Result Value Ref Range   Neisseria Gonorrhea Negative    Chlamydia Negative    Trichomonas Negative    Adequacy      Satisfactory for evaluation; transformation zone component PRESENT.   Diagnosis      - Negative for intraepithelial lesion or malignancy (NILM)   Comment Normal Reference Range Trichomonas - Negative    Comment Normal Reference [facility] Chlamydia - Negative    Comment      Normal Reference Range Neisseria Gonorrhea - Negative       Assessment &  Plan:     ICD-10-CM   1. Lesion of skin of scalp  L98.9 Ambulatory referral to Dermatology  Skin lesion to right scalp, started to get rapidly larger, looks similar to skin tag, soft, fleshy color, now that its bigger she is noting frequent trauma to it with combs/brushing hair  No signs of infection  Office does not have appropriate supplies for excision, and pt wishes for dermatology consult.        Danelle Berry, PA-C 06/22/23 3:53 PM

## 2023-06-22 NOTE — Patient Instructions (Signed)
[facility] DERMATOLOGY AND SKIN CANCER Dermatology (Skin) --Board Certification [address], [place], [place] [address] [phone]

## 2023-06-30 ENCOUNTER — Encounter: Payer: Self-pay | Admitting: Family Medicine

## 2023-07-05 ENCOUNTER — Telehealth (INDEPENDENT_AMBULATORY_CARE_PROVIDER_SITE_OTHER): Payer: Medicaid Other | Admitting: Family Medicine

## 2023-07-05 ENCOUNTER — Encounter: Payer: Self-pay | Admitting: Family Medicine

## 2023-07-05 VITALS — Ht 67.0 in | Wt 164.0 lb

## 2023-07-05 DIAGNOSIS — F17200 Nicotine dependence, unspecified, uncomplicated: Secondary | ICD-10-CM

## 2023-07-05 DIAGNOSIS — Z716 Tobacco abuse counseling: Secondary | ICD-10-CM

## 2023-07-05 DIAGNOSIS — F17210 Nicotine dependence, cigarettes, uncomplicated: Secondary | ICD-10-CM | POA: Diagnosis not present

## 2023-07-05 MED ORDER — VARENICLINE TARTRATE 0.5 MG PO TABS: ORAL_TABLET | ORAL | 0 refills | Status: DC

## 2023-07-05 MED ORDER — VARENICLINE TARTRATE 1 MG PO TABS: 1.0000 mg | ORAL_TABLET | Freq: Two times a day (BID) | ORAL | 1 refills | Status: DC

## 2023-07-05 NOTE — Progress Notes (Signed)
Name: Whitney Wong   MRN: [id number]    DOB: 14-Nov-1996   Date:07/05/2023       Progress Note  Subjective:    Chief Complaint  Chief Complaint  Patient presents with   Smoking Cessation    I connected with  Erskine Speed  on 07/05/23 at 10:40 AM EDT by a video enabled telemedicine application and verified that I am speaking with the correct person using two identifiers.  I discussed the limitations of evaluation and management by telemedicine and the availability of in person appointments. The patient expressed understanding and agreed to proceed. Staff also discussed with the patient that there may be a patient responsible charge related to this service. 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 (Patient not taking: Reported on 07/05/2023), Disp: 84 tablet, Rfl: 4  Allergies  Allergen Reactions   Penicillins Hives    I personally reviewed active problem list, medication list, allergies, family history, social history, health maintenance, notes from last  encounter, lab results, imaging with the patient/caregiver today.   Review of Systems  Constitutional: Negative.   HENT: Negative.    Eyes: Negative.   Respiratory: Negative.    Cardiovascular: Negative.   Gastrointestinal: Negative.   Endocrine: Negative.   Genitourinary: Negative.   Musculoskeletal: Negative.   Skin: Negative.   Allergic/Immunologic: Negative.   Neurological: Negative.   Hematological: Negative.   Psychiatric/Behavioral: Negative.    All other systems reviewed and are negative.     Objective:   Virtual encounter, vitals limited, only able to obtain the following Today's Vitals   07/05/23 1018  Weight: 164 lb (74.4 kg)  Height: 5\' 7"  (1.702 m)   Body mass index is 25.69 kg/m. Nursing Note and Vital Signs reviewed.  Physical Exam Vitals and nursing note reviewed.  Pulmonary:     Effort: No respiratory distress.  Neurological:     Mental Status: She is alert.  Psychiatric:        Mood and Affect: Mood normal.     PE limited by virtual encounter  No results found for this or any previous visit (from the past 72 hour(s)).  Assessment and Plan:     ICD-10-CM   1. Encounter for smoking cessation counseling  Z71.6 varenicline (CHANTIX) 0.5 MG tablet    varenicline (CHANTIX) 1 MG tablet    2. Current smoker  F17.200     Smoking cessation instruction/counseling given:  counseled patient on the dangers of tobacco use, advised patient to stop smoking, and reviewed  strategies to maximize success  Spent entirety of visit discussing smoking cessation 20 min + Reviewed SE of chantix, red flags, how to take and use and info given on approaches to stopping - cold [place] vs weaning off Will do f/up Back up med plan is to try wellbutrin  -Red flags and when to present for emergency care or RTC including fever >101.8F, chest pain, shortness of breath, new/worsening/un-resolving symptoms, reviewed with patient at time of visit. Follow up and care  instructions discussed and provided in AVS. - I discussed the assessment and treatment plan with the patient. The patient was provided an opportunity to ask questions and all were answered. The patient agreed with the plan and demonstrated an understanding of the instructions.  I provided 28 minutes of non-face-to-face time during this encounter.  Danelle Berry, PA-C 07/05/23 11:22 AM

## 2023-07-05 NOTE — Patient Instructions (Addendum)
Steps to Quit Smoking Smoking tobacco is the leading cause of preventable death. It can affect almost every organ in the body. Smoking puts you and people around you at risk for many serious, long-lasting (chronic) diseases. Quitting smoking can be hard, but it is one of the best things that you can do for your health. It is never too late to quit. Do not give up if you cannot quit the first time. Some people need to try many times to quit. Do your best to stick to your quit plan, and talk with your doctor if you have any questions or concerns. How do I get ready to quit? Pick a date to quit. Set a date within the next 2 weeks to give you time to prepare. Write down the reasons why you are quitting. Keep this list in places where you will see it often. Tell your family, friends, and co-workers that you are quitting. Their support is important. Talk with your doctor about the choices that may help you quit. Find out if your health insurance will pay for these treatments. Know the people, places, things, and activities that make you want to smoke (triggers). Avoid them. What first steps can I take to quit smoking? Throw away all cigarettes at home, at work, and in your car. Throw away the things that you use when you smoke, such as ashtrays and lighters. Clean your car. Empty the ashtray. Clean your home, including curtains and carpets. What can I do to help me quit smoking? Talk with your doctor about taking medicines and seeing a counselor. You are more likely to succeed when you do both. If you are pregnant or breastfeeding: Talk with your doctor about counseling or other ways to quit smoking. Do not take medicine to help you quit smoking unless your doctor tells you to. Quit right away Quit smoking completely, instead of slowly cutting back on how much you smoke over a period of time. Stopping smoking right away may be more successful than slowly quitting. Go to counseling. In-person is best  if this is an option. You are more likely to quit if you go to counseling sessions regularly. Take medicine You may take medicines to help you quit. Some medicines need a prescription, and some you can buy over-the-counter. Some medicines may contain a drug called nicotine to replace the nicotine in cigarettes. Medicines may: Help you stop having the desire to smoke (cravings). Help to stop the problems that come when you stop smoking (withdrawal symptoms). Your doctor may ask you to use: Nicotine patches, gum, or lozenges. Nicotine inhalers or sprays. Non-nicotine medicine that you take by mouth. Find resources Find resources and other ways to help you quit smoking and remain smoke-free after you quit. They include: Online chats with a Veterinary surgeon. Phone quitlines. Printed Materials engineer. Support groups or group counseling. Text messaging programs. Mobile phone apps. Use apps on your mobile phone or tablet that can help you stick to your quit plan. Examples of free services include Quit Guide from the CDC and smokefree.gov  What can I do to make it easier to quit?  Talk to your family and friends. Ask them to support and encourage you. Call a phone quitline, such as 1-800-QUIT-NOW, reach out to support groups, or work with a Veterinary surgeon. Ask people who smoke to not smoke around you. Avoid places that make you want to smoke, such as: Bars. Parties. Smoke-break areas at work. Spend time with people who do not smoke. Lower  the stress in your life. Stress can make you want to smoke. Try these things to lower stress: Getting regular exercise. Doing deep-breathing exercises. Doing yoga. Meditating. What benefits will I see if I quit smoking? Over time, you may have: A better sense of smell and taste. Less coughing and sore throat. A slower heart rate. Lower blood pressure. Clearer skin. Better breathing. Fewer sick days. Summary Quitting smoking can be hard, but it is one of  the best things that you can do for your health. Do not give up if you cannot quit the first time. Some people need to try many times to quit. When you decide to quit smoking, make a plan to help you succeed. Quit smoking right away, not slowly over a period of time. When you start quitting, get help and support to keep you smoke-free. This information is not intended to replace advice given to you by your health care provider. Make sure you discuss any questions you have with your health care provider. Document Revised: 10/23/2021 Document Reviewed: 10/23/2021 Elsevier Patient Education  2024 Elsevier Inc.   [place] -   Smoking Cessation Set a date to stop smoking and begin varenicline 1 week before this date  Alternatively, the patient can begin varenicline and then quit smoking between days 8 and 35 of treatment  Quit smoking date regimen Initiate regimen 1 week before quit smoking date Days 1-3: 0.5 mg by mouth once daily Days 4-7: 0.5 mg by mouth twice daily  Day 8 to end of treatment: 1 mg by mouth twice daily  If quitting is successful after 12 weeks, continue another 12 weeks at 1 mg q12hr Gradual approach to quitting For patients who are sure that they are not able or willing to quit abruptly, consider a gradual approach to quitting smoking with varenicline Begin dosing and reduce smoking by 50% from baseline within the first 4 weeks, by an additional 50% in the next 4 weeks, and continue reducing with the goal of reaching complete abstinence by 12 weeks Continue varenicline for an additional 12 weeks, for a total of 24 weeks of treatment Encourage patients to attempt quitting sooner if they feel ready

## 2023-07-16 ENCOUNTER — Encounter: Payer: Self-pay | Admitting: Family Medicine

## 2024-01-11 ENCOUNTER — Telehealth: Payer: Medicaid Other

## 2024-03-01 ENCOUNTER — Encounter: Payer: Self-pay | Admitting: Family Medicine

## 2024-03-13 ENCOUNTER — Encounter: Payer: Self-pay | Admitting: Family Medicine

## 2024-03-13 ENCOUNTER — Ambulatory Visit: Admitting: Family Medicine

## 2024-03-13 DIAGNOSIS — R197 Diarrhea, unspecified: Secondary | ICD-10-CM | POA: Diagnosis not present

## 2024-03-13 DIAGNOSIS — Z0289 Encounter for other administrative examinations: Secondary | ICD-10-CM | POA: Diagnosis not present

## 2024-03-13 DIAGNOSIS — N921 Excessive and frequent menstruation with irregular cycle: Secondary | ICD-10-CM

## 2024-03-13 NOTE — Progress Notes (Signed)
 Name: Whitney Wong   MRN: [id number]    DOB: [birth year]   Date:03/13/2024       Progress Note  Subjective:    Chief Complaint  Chief Complaint  Patient presents with   ADA Forms    Pt has no gallbladder and heavy periods. Requires more frequent bathroom breaks. Would like coverage for 2 additional 10 minute breaks daily, 5 days a week.    I connected with  Loretto Ronde  on 03/13/24 at 11:00 AM EDT by a video enabled telemedicine application and verified that I am speaking with the correct person using two identifiers.  I discussed the limitations of evaluation and management by telemedicine and the availability of in person appointments. The patient expressed understanding and agreed to proceed. Staff also discussed with the patient that there may be a patient responsible charge related to this service. Patient Location: home Provider Location: [facility] clinic office Additional Individuals present: none  HPI  Pt scheduled appt to get documentation for work allowing her to use the restroom more than current breaks, 2 x a day for 15 min, she has to clock out for use over that and gets penalized for clocking out time and occurances She works from home, works 8h + mandatory OT often, med issues include heavy mentrual cycles and diarrhea that often cuase her to need to go to the bathroom for more than 2 x in a 8+ hour period.  Menorrhagia ongoing for years - she states managed with past GYN in [place], got worse after having ovarian torsion. Heavy bleeding with irregular cycles, last 5 days, 4 d "consistent flow" d 4-5 slows down, on d 1-4 changes pad every 2 hours of overnight pads, not using tampons.  Patient's last menstrual period was 02/16/2024 (exact date).    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 (Patient not taking: Reported on 03/13/2024), Disp: 84 tablet, Rfl: 4   varenicline  (CHANTIX ) 0.5 MG tablet, Start 0.5 mg po once daily x 3 d, then 0.5 mg po BID x 4 d (Patient not taking: Reported on 03/13/2024), Disp: 11 tablet, Rfl: 0   varenicline  (CHANTIX ) 1 MG tablet, Take 1 tablet (1 mg total) by mouth 2 (two) times daily. (Patient not taking: Reported on 03/13/2024), Disp: 60 tablet, Rfl: 1  Allergies  Allergen Reactions   Penicillins Hives    I personally reviewed active problem list, medication list, allergies, family history, social history, health maintenance, notes from last encounter, lab results, imaging with the patient/caregiver today.  Review of Systems  Constitutional: Negative.   HENT: Negative.    Eyes: Negative.   Respiratory: Negative.    Cardiovascular: Negative.   Gastrointestinal: Negative.   Endocrine: Negative.   Genitourinary: Negative.   Musculoskeletal: Negative.   Skin: Negative.   Allergic/Immunologic: Negative.   Neurological: Negative.   Hematological: Negative.   Psychiatric/Behavioral: Negative.    All other systems reviewed and are negative.     Objective:   Virtual encounter, vitals limited, only able to  obtain the following There were no vitals filed for this visit. There is no height or weight on file to calculate BMI. Nursing Note and Vital Signs reviewed.  Physical Exam Vitals and nursing note reviewed.  Constitutional:      General: She is not in acute distress.    Appearance: She is not ill-appearing, toxic-appearing or diaphoretic.  Pulmonary:     Effort: No respiratory distress.  Skin:    Coloration: Skin is not pale.  Neurological:     Mental Status: She is alert.  Psychiatric:        Mood and Affect: Mood normal.     PE limited by virtual encounter  No results found for this or any previous visit (from the past 72 hours).  Assessment and Plan:     ICD-10-CM   1. Menorrhagia with irregular cycle  N92.1    recommend eval in office, labs to screen for anemia, tx options available, for now with menses increased bathroom breaks is appropriate to manage bleeding    2. Diarrhea, unspecified type  R19.7    intermittent x 15 years since cholecystectomy at least 3 episodes a week requiring urgent bathroom breaks which she cannot plan for    3. Encounter for completion of form with patient  Z02.89      Forms completed to ask for more bathroom breaks (2 more a day for up to 10 min each) which is medically required and appropriate to manage intermittent urgent diarrhea and menorrhagia with regular cycle both require additional bathroom breaks exceeding the current allowed amount with the patient's job.  Estimate that 2 current breaks with 2 additional breaks will allow the patient to handle feminine hygiene issues related to menorrhagia with regular cycle and intermittent diarrhea.  Will complete ADA medical certification form and send copy to patient and submit the other copy per directions  Pt encouraged to get CPE in the next 6 months since none x 3 years - in forms OV 1-2 x a year appropriate to manage health and these dx above   -Red flags and when to present for emergency  care or RTC including fever >101.40F, chest pain, shortness of breath, new/worsening/un-resolving symptoms, reviewed with patient at time of visit. Follow up and care instructions discussed and provided in AVS. - I discussed the assessment and treatment plan with the patient. The patient was provided an opportunity to ask questions and all were answered. The patient agreed with the plan and demonstrated an understanding of the instructions.  I provided 30+ minutes of non-face-to-face time during this encounter, and including chart review and form completion time  Adeline Hone, PA-C 03/13/24 11:22 AM

## 2024-07-06 ENCOUNTER — Encounter: Payer: Self-pay | Admitting: Family Medicine

## 2024-07-06 ENCOUNTER — Ambulatory Visit (INDEPENDENT_AMBULATORY_CARE_PROVIDER_SITE_OTHER): Admitting: Family Medicine

## 2024-07-06 ENCOUNTER — Ambulatory Visit: Attending: Family Medicine

## 2024-07-06 VITALS — BP 124/70 | HR 93 | Resp 16 | Ht 67.0 in | Wt 170.0 lb

## 2024-07-06 DIAGNOSIS — R55 Syncope and collapse: Secondary | ICD-10-CM | POA: Diagnosis not present

## 2024-07-06 DIAGNOSIS — N921 Excessive and frequent menstruation with irregular cycle: Secondary | ICD-10-CM

## 2024-07-06 DIAGNOSIS — R42 Dizziness and giddiness: Secondary | ICD-10-CM | POA: Diagnosis not present

## 2024-07-06 DIAGNOSIS — R202 Paresthesia of skin: Secondary | ICD-10-CM

## 2024-07-06 DIAGNOSIS — R413 Other amnesia: Secondary | ICD-10-CM | POA: Diagnosis not present

## 2024-07-06 DIAGNOSIS — G2581 Restless legs syndrome: Secondary | ICD-10-CM

## 2024-07-06 NOTE — Progress Notes (Signed)
 Patient ID: Whitney Wong, female    DOB: [birth year], 28 y.o.   MRN: [id number]  PCP: Leavy Mole, PA-C  Chief Complaint  Patient presents with   Restless Leg    Has to massage them out, feels restless. Worsening   Memory Loss    Short term memory- feels like worsening.   Consult    Stands up and everything whites out and ears start ringing    Subjective:   Whitney Wong is a 28 y.o. female, presents to clinic with CC of the following:  HPI     Near syncope  Reports sx since she was a child - when she stands up ringing in her ears and sometimes lightheadedness getting worse with vision whiting out, one syncope episode about a year ago - more often more severe Orthostatic VS for the past 72 hrs (Last 3 readings):  Orthostatic BP Patient Position BP Location Cuff Size Orthostatic Pulse  07/06/24 1102 116/70 Standing Left Arm Normal 72  07/06/24 1101 118/68 Sitting Left Arm Normal 73  07/06/24 1100 122/78 Supine Left Arm Normal 81     Orthostatics reviewed and  NEG   Discussed the use of AI scribe software for clinical note transcription with the patient, who gave verbal consent to proceed.  History of Present Illness Whitney Wong is a 28 year old female who presents with worsening symptoms of dizziness, leg discoloration, and restless legs.  Orthostatic symptoms - Dizziness and near-syncope worsening since adolescence - Episodes triggered by standing, accompanied by tinnitus and sensation of impending syncope - Increased frequency of episodes, sometimes causing unsteadiness - Brief loss of consciousness after standing quickly approximately one year ago - Symptoms worsen with rapid postural changes - Associated diaphoresis and feeling overheated - Increased heart rate upon standing  Lower extremity discoloration and restless legs - Persistent splotchy discoloration of legs, temporarily improved with massage - Restless legs, particularly at night, causing  frequent leg movement - No prior evaluation for these symptoms  Menstrual irregularities and menorrhagia - Irregular and heavy menstrual periods - Last menstrual period started on July 29 and lasted until August 4, with heavy flow for the first four days - Large blood clots during menstruation - Cravings prior to onset of menses  Dietary habits and supplement use - Diet consists of whole, organic foods including salads, steak, chicken, vegetables, and fruits - No current use of supplements or vitamins except for coffee - Previously took vitamin B12 but ran out       Patient Active Problem List   Diagnosis Date Noted   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   Allergies  Allergen Reactions   Penicillins Hives     Social History   Tobacco Use   Smoking status: Every Day    Current packs/day: 0.25    Types: Cigarettes   Smokeless tobacco: Never  Vaping Use   Vaping status: Never Used  Substance Use Topics   Alcohol use: Never   Drug use: Not Currently    Types: Heroin, Marijuana    Comment: methadone 120mg  daily [facility]      Chart Review Today:   Review of Systems  Constitutional: Negative.   HENT: Negative.    Eyes: Negative.   Respiratory: Negative.    Cardiovascular: Negative.   Gastrointestinal: Negative.  Endocrine: Negative.   Genitourinary: Negative.   Musculoskeletal: Negative.   Skin: Negative.   Allergic/Immunologic: Negative.   Neurological: Negative.   Hematological: Negative.   Psychiatric/Behavioral: Negative.    All other systems reviewed and are negative.      Objective:   Vitals:   07/06/24 1050  BP: 124/70  Pulse: 93  Resp: 16  SpO2: 100%  Weight: 170 lb (77.1 kg)  Height: 5' 7 (1.702 m)    Body mass index  is 26.63 kg/m.  Physical Exam Vitals and nursing note reviewed.  Constitutional:      General: She is not in acute distress.    Appearance: Normal appearance. She is well-developed. She is not ill-appearing, toxic-appearing or diaphoretic.  HENT:     Head: Normocephalic and atraumatic.     Right Ear: External ear normal.     Left Ear: External ear normal.     Nose: Nose normal.  Eyes:     General: No scleral icterus.       Right eye: No discharge.        Left eye: No discharge.     Conjunctiva/sclera: Conjunctivae normal.  Neck:     Trachea: No tracheal deviation.  Cardiovascular:     Rate and Rhythm: Normal rate and regular rhythm.     Pulses: Normal pulses.     Heart sounds: Normal heart sounds.  Pulmonary:     Effort: Pulmonary effort is normal. No respiratory distress.     Breath sounds: Normal breath sounds. No stridor. No wheezing, rhonchi or rales.  Skin:    General: Skin is warm and dry.     Findings: No rash.  Neurological:     Mental Status: She is alert.     Motor: No abnormal muscle tone.     Coordination: Coordination normal.     Gait: Gait normal.  Psychiatric:        Mood and Affect: Mood normal.        Behavior: Behavior normal.      Results for orders placed or performed in visit on 12/17/20  Cytology - PAP   Collection Time: 12/17/20  3:30 PM  Result Value Ref Range   Neisseria Gonorrhea Negative    Chlamydia Negative    Trichomonas Negative    Adequacy      Satisfactory for evaluation; transformation zone component PRESENT.   Diagnosis      - Negative for intraepithelial lesion or malignancy (NILM)   Comment Normal Reference Range Trichomonas - Negative    Comment Normal Reference Ranger Chlamydia - Negative    Comment      Normal Reference Range Neisseria Gonorrhea - Negative       Assessment & Plan:   Assessment & Plan   Presyncope and syncope with dizziness and giddiness Episodes of presyncope and syncope with dizziness, negative  orthostatic vitals. Differential includes autonomic dysfunction, deficiencies, will do heart monitor to r/o arhythmia - ongoing since childhood  - Order heart monitor for two-week data collection. - Refer to cardiology if necessary.  Restless legs syndrome and lower extremity paresthesias Chronic restless legs and paresthesias, possibly linked to iron deficiency. - Order labs for iron deficiency and other deficiencies.  Cognitive symptoms (memory issues, brain fog) Cognitive symptoms possibly linked to deficiencies, particularly iron deficiency. - Order labs for iron deficiency and other deficiencies. -could also consider r/o OSA   Recording duration: 27 minutes  1. Paresthesias (Primary) Leg sx and multiple other sx like near syncope, fatigue  R/o deficiencies, suspect possibly IDA R/o thyroid disease and check other basic labs - CBC with Differential/Platelet - Comprehensive metabolic panel with GFR - Vitamin B12 - Iron, TIBC and Ferritin Panel - Thyroid Panel With TSH  2. Memory changes Foggy, trouble with memory - CBC with Differential/Platelet - Comprehensive metabolic panel with GFR - Vitamin B12 - Iron, TIBC and Ferritin Panel - Thyroid Panel With TSH  3. Postural dizziness with near syncope Orthostatics here neg, sometimes she has palpitations and increased HR/forceful feeling in chest with position changes will do holter monitor also get some basic labs done to eval for anemia, deficiencies, r/o hypothyroid or electrolyte abnormalities - LONG TERM MONITOR (3-14 DAYS)  4. Syncope, unspecified syncope type 1 episode in the last year, no eval  - LONG TERM MONITOR (3-14 DAYS)  5. Restless leg syndrome Achy legs, changes in sensation, skin color is splotchy (reticular) she can improve by rubbing legs and moving them - CBC with Differential/Platelet - Comprehensive metabolic panel with GFR - Vitamin B12 - Iron, TIBC and Ferritin Panel - Thyroid Panel With TSH  6.  Menorrhagia with irregular cycle Not on birth control per her GYN  If she has deficiencies she will consider taking the OCP - junel Still very irregular and cycles often very heavy with large clots and needing expensive heavy duty pads Excessive and frequent menstruation with irregular cycle Menorrhagia with irregular cycles, potential iron deficiency anemia due to chronic blood loss. Open to Junel for cycle regulation and symptom management. - Order labs for iron deficiency and other deficiencies. - Prescribe Junel for menstrual regulation and reduced bleeding. - Consider pelvic ultrasound or GYN f/up if no improvement.   Michelene Cower, PA-C 07/06/24 11:06 AM

## 2024-07-07 LAB — CBC WITH DIFFERENTIAL/PLATELET
Absolute Lymphocytes: 1720 {cells}/uL (ref 850–3900)
Absolute Monocytes: 577 {cells}/uL (ref 200–950)
Basophils Absolute: 41 {cells}/uL (ref 0–200)
Basophils Relative: 0.4 %
Eosinophils Absolute: 72 {cells}/uL (ref 15–500)
Eosinophils Relative: 0.7 %
HCT: 42.7 % (ref 35.0–45.0)
Hemoglobin: 14 g/dL (ref 11.7–15.5)
MCH: 31.3 pg (ref 27.0–33.0)
MCHC: 32.8 g/dL (ref 32.0–36.0)
MCV: 95.3 fL (ref 80.0–100.0)
MPV: 10.6 fL (ref 7.5–12.5)
Monocytes Relative: 5.6 %
Neutro Abs: 7890 {cells}/uL — ABNORMAL HIGH (ref 1500–7800)
Neutrophils Relative %: 76.6 %
Platelets: 238 Thousand/uL (ref 140–400)
RBC: 4.48 Million/uL (ref 3.80–5.10)
RDW: 12.7 % (ref 11.0–15.0)
Total Lymphocyte: 16.7 %
WBC: 10.3 Thousand/uL (ref 3.8–10.8)

## 2024-07-07 LAB — IRON,TIBC AND FERRITIN PANEL
%SAT: 21 % (ref 16–45)
Ferritin: 38 ng/mL (ref 16–154)
Iron: 72 ug/dL (ref 40–190)
TIBC: 337 ug/dL (ref 250–450)

## 2024-07-07 LAB — COMPREHENSIVE METABOLIC PANEL WITH GFR
AG Ratio: 2.3 (calc) (ref 1.0–2.5)
ALT: 15 U/L (ref 6–29)
AST: 16 U/L (ref 10–30)
Albumin: 5 g/dL (ref 3.6–5.1)
Alkaline phosphatase (APISO): 55 U/L (ref 31–125)
BUN: 13 mg/dL (ref 7–25)
CO2: 25 mmol/L (ref 20–32)
Calcium: 9.7 mg/dL (ref 8.6–10.2)
Chloride: 105 mmol/L (ref 98–110)
Creat: 0.58 mg/dL (ref 0.50–0.97)
Globulin: 2.2 g/dL (ref 1.9–3.7)
Glucose, Bld: 86 mg/dL (ref 65–99)
Potassium: 4.1 mmol/L (ref 3.5–5.3)
Sodium: 140 mmol/L (ref 135–146)
Total Bilirubin: 0.5 mg/dL (ref 0.2–1.2)
Total Protein: 7.2 g/dL (ref 6.1–8.1)
eGFR: 125 mL/min/1.73m2 (ref >=60)

## 2024-07-07 LAB — THYROID PANEL WITH TSH
Free Thyroxine Index: 2.1 (ref 1.4–3.8)
T3 Uptake: 29 % (ref 22–35)
T4, Total: 7.3 ug/dL (ref 5.1–11.9)
TSH: 1.2 m[IU]/L

## 2024-07-07 LAB — VITAMIN B12: Vitamin B-12: 264 pg/mL (ref 200–1100)

## 2024-07-11 ENCOUNTER — Ambulatory Visit: Payer: Self-pay | Admitting: Family Medicine
